# Patient Record
Sex: Female | Born: 1980 | Race: White | Hispanic: No | Marital: Single | Smoking: Never smoker
Health system: Southern US, Community
[De-identification: ages and names within clinical notes are randomized; demographics above are authoritative.]

## PROBLEM LIST (undated history)

## (undated) DIAGNOSIS — G4730 Sleep apnea, unspecified: Secondary | ICD-10-CM

## (undated) DIAGNOSIS — M5412 Radiculopathy, cervical region: Secondary | ICD-10-CM

## (undated) DIAGNOSIS — K5900 Constipation, unspecified: Secondary | ICD-10-CM

## (undated) DIAGNOSIS — R0602 Shortness of breath: Secondary | ICD-10-CM

## (undated) DIAGNOSIS — K8020 Calculus of gallbladder without cholecystitis without obstruction: Secondary | ICD-10-CM

## (undated) DIAGNOSIS — R61 Generalized hyperhidrosis: Secondary | ICD-10-CM

## (undated) DIAGNOSIS — L659 Nonscarring hair loss, unspecified: Secondary | ICD-10-CM

## (undated) DIAGNOSIS — F419 Anxiety disorder, unspecified: Secondary | ICD-10-CM

## (undated) DIAGNOSIS — K219 Gastro-esophageal reflux disease without esophagitis: Secondary | ICD-10-CM

## (undated) DIAGNOSIS — J302 Other seasonal allergic rhinitis: Secondary | ICD-10-CM

## (undated) DIAGNOSIS — F410 Panic disorder [episodic paroxysmal anxiety] without agoraphobia: Secondary | ICD-10-CM

## (undated) DIAGNOSIS — R5381 Other malaise: Secondary | ICD-10-CM

## (undated) DIAGNOSIS — G5603 Carpal tunnel syndrome, bilateral upper limbs: Secondary | ICD-10-CM

## (undated) DIAGNOSIS — M1990 Unspecified osteoarthritis, unspecified site: Secondary | ICD-10-CM

## (undated) DIAGNOSIS — I10 Essential (primary) hypertension: Secondary | ICD-10-CM

## (undated) DIAGNOSIS — F32A Depression, unspecified: Secondary | ICD-10-CM

## (undated) HISTORY — DX: Shortness of breath: R06.02

## (undated) HISTORY — DX: Other malaise: R53.81

## (undated) HISTORY — DX: Nonscarring hair loss, unspecified: L65.9

## (undated) HISTORY — DX: Generalized hyperhidrosis: R61

## (undated) HISTORY — DX: Anxiety disorder, unspecified: F41.9

## (undated) HISTORY — DX: Radiculopathy, cervical region: M54.12

## (undated) HISTORY — PX: OTHER SURGICAL HISTORY: SHX169

---
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
## 1999-03-21 ENCOUNTER — Inpatient Hospital Stay (HOSPITAL_COMMUNITY): Admission: AD | Admit: 1999-03-21 | Discharge: 1999-03-24 | Payer: Self-pay | Admitting: Obstetrics & Gynecology

## 2001-03-23 ENCOUNTER — Emergency Department (HOSPITAL_COMMUNITY): Admission: EM | Admit: 2001-03-23 | Discharge: 2001-03-23 | Payer: Self-pay | Admitting: Emergency Medicine

## 2001-04-29 ENCOUNTER — Ambulatory Visit (HOSPITAL_COMMUNITY): Admission: RE | Admit: 2001-04-29 | Discharge: 2001-04-29 | Payer: Self-pay | Admitting: *Deleted

## 2001-06-28 ENCOUNTER — Ambulatory Visit (HOSPITAL_COMMUNITY): Admission: RE | Admit: 2001-06-28 | Discharge: 2001-06-28 | Payer: Self-pay | Admitting: *Deleted

## 2001-11-15 ENCOUNTER — Inpatient Hospital Stay (HOSPITAL_COMMUNITY): Admission: AD | Admit: 2001-11-15 | Discharge: 2001-11-15 | Payer: Self-pay | Admitting: *Deleted

## 2001-11-18 ENCOUNTER — Inpatient Hospital Stay (HOSPITAL_COMMUNITY): Admission: AD | Admit: 2001-11-18 | Discharge: 2001-11-18 | Payer: Self-pay | Admitting: Obstetrics

## 2001-11-25 ENCOUNTER — Inpatient Hospital Stay (HOSPITAL_COMMUNITY): Admission: AD | Admit: 2001-11-25 | Discharge: 2001-11-26 | Payer: Self-pay | Admitting: Obstetrics & Gynecology

## 2002-12-12 ENCOUNTER — Other Ambulatory Visit: Admission: RE | Admit: 2002-12-12 | Discharge: 2002-12-12 | Payer: Self-pay | Admitting: Family Medicine

## 2004-03-03 ENCOUNTER — Other Ambulatory Visit: Admission: RE | Admit: 2004-03-03 | Discharge: 2004-03-03 | Payer: Self-pay | Admitting: Family Medicine

## 2004-08-02 ENCOUNTER — Emergency Department (HOSPITAL_COMMUNITY): Admission: EM | Admit: 2004-08-02 | Discharge: 2004-08-02 | Payer: Self-pay | Admitting: Emergency Medicine

## 2007-11-12 ENCOUNTER — Encounter (INDEPENDENT_AMBULATORY_CARE_PROVIDER_SITE_OTHER): Payer: Self-pay | Admitting: Unknown Physician Specialty

## 2007-11-12 ENCOUNTER — Other Ambulatory Visit: Admission: RE | Admit: 2007-11-12 | Discharge: 2007-11-12 | Payer: Self-pay | Admitting: Unknown Physician Specialty

## 2008-02-05 ENCOUNTER — Other Ambulatory Visit: Admission: RE | Admit: 2008-02-05 | Discharge: 2008-02-05 | Payer: Self-pay | Admitting: Obstetrics and Gynecology

## 2008-07-20 ENCOUNTER — Inpatient Hospital Stay (HOSPITAL_COMMUNITY): Admission: AD | Admit: 2008-07-20 | Discharge: 2008-07-21 | Payer: Self-pay | Admitting: Obstetrics & Gynecology

## 2008-08-05 ENCOUNTER — Inpatient Hospital Stay (HOSPITAL_COMMUNITY): Admission: AD | Admit: 2008-08-05 | Discharge: 2008-08-08 | Payer: Self-pay | Admitting: Obstetrics and Gynecology

## 2008-08-05 ENCOUNTER — Encounter: Payer: Self-pay | Admitting: Obstetrics and Gynecology

## 2008-08-05 ENCOUNTER — Ambulatory Visit: Payer: Self-pay | Admitting: Obstetrics and Gynecology

## 2009-11-11 ENCOUNTER — Emergency Department (HOSPITAL_COMMUNITY): Admission: EM | Admit: 2009-11-11 | Discharge: 2009-11-11 | Payer: Self-pay | Admitting: Emergency Medicine

## 2011-01-22 LAB — RAPID STREP SCREEN (MED CTR MEBANE ONLY): Streptococcus, Group A Screen (Direct): NEGATIVE

## 2011-03-21 NOTE — Op Note (Signed)
Tiffany Ho, Tiffany Ho            ACCOUNT NO.:  [id number]   MEDICAL RECORD NO.:  [id number]          PATIENT TYPE:  INP   LOCATION:  9111                          FACILITY:  [facility]   PHYSICIAN:  Tilda Burrow, M.D. DATE OF BIRTH:  02-11-1981   DATE OF PROCEDURE:  08/05/2008  DATE OF DISCHARGE:                               OPERATIVE REPORT   PREOPERATIVE DIAGNOSES:  1. Pregnancy at 38-1/2 weeks' gestation.  2. Partial placenta previa.  3. Third-trimester bleeding.   POSTOPERATIVE DIAGNOSES:  1. Pregnancy at 38-1/2 weeks' gestation.  2. Partial placenta previa.  3. Third-trimester bleeding.   PROCEDURE:  Primary low-transverse cervical cesarean section.   SURGEON:  Tilda Burrow, MD   ASSISTANT:  Odie Sera, DO   ANESTHESIA:  Spinal.   COMPLICATIONS:  None.   FINDINGS:  Posteriorly oriented placenta with partial previa, less than  5% of placenta separated from the cervical internal os.   INDICATIONS:  A 30 year old female presenting at 38-1/2 weeks' gestation  with second episode of relatively painless vaginal bleeding of  significant volume.  I ultrasounded once again here at this time  identifying by transvaginal ultrasound that there was a posterior  placenta with partial previa with placenta 2.5 cm thick that was  overlying the internal os by at least 3 cm.  Decision for C-section  recommended.   DETAILS OF PROCEDURE:  The patient was taken to the operating room and  spinal anesthesia introduced.  There was some delay in the spinal taking  full effect.  Fetal heart was documented prior to prepping and draping.  The Pfannenstiel-type incision was performed sharply down to the fascia  and opened transversely.  Dissected off the underlying fascia and split  in the midline.  Bladder flap was developed in the same way on the on  lower uterine segment and the uterus opened transversely with sharp  dissection.  Once the amniotic fluid was encountered, the rest of  the  incision was opened manually and extended cephalad and caudad.  The  fetal vertex was rotated into the incision and delivered with fundal  pressure and manual guidance of the vertex.  Baby was somewhat depressed  initially and was handed to Dr. Francine Graven with some extra cord attached  to the baby.  We then proceeded with obtaining cord blood and routine  blood samples.  IV oxytocin was initiating.  There was no record of  blood loss at this time, but the once the uterine tone was firm, the  placenta was spontaneously expressed in Devens presentation.  There  were no membranes left behind.  The placental bed was then inspected and  reached down to the internal os and either barely over it or immediately  adjacent to it.  There was a significant amount of decidua, but no  active bleeding.  Two-layer running-locking closure of the uterine  incision followed using 0-Vicryl.  The bladder flap was reapproximated  loosely with running 2-0 chromic.  Abdomen was emptied.  Sponge and  needle counts were correct.  Anterior peritoneum closed.  The rectus  muscles were pulled in the midline with  2-0 chromic and fascia closed  with running 0-Vicryl.  Subcutaneous tissues were reapproximated with 2  interrupted sutures of 2-0 plain and staple closure of the skin  completed the procedure.  Lower abdomen was quite lax and uterus very  mobile.  Myometrium was unusually thick on the anterior wall.  The  patient tolerated the procedure well with recovering with EBL 800 mL and  300 mL obtained during the procedure.  Sponge and needle counts were  correct.      Tilda Burrow, M.D.  Electronically Signed     JVF/MEDQ  D:  08/06/2008  T:  08/07/2008  Job:  [id number]   cc:   [facility]

## 2011-03-21 NOTE — Discharge Summary (Signed)
Tiffany Ho, Tiffany Ho            ACCOUNT NO.:  [id number]   MEDICAL RECORD NO.:  [id number]          PATIENT TYPE:  INP   LOCATION:  9111                          FACILITY:  [facility]   PHYSICIAN:  Lesly Dukes, M.D. DATE OF BIRTH:  February 12, 1981   DATE OF ADMISSION:  08/05/2008  DATE OF DISCHARGE:  08/08/2008                               DISCHARGE SUMMARY   DIAGNOSES AT ADMISSION:  1. Painless vaginal bleeding.  2. Term pregnancy.   DIAGNOSIS AT DISCHARGE:  Status post low-transverse cesarean section for  placenta previa with viable female infant.   PROCEDURE:  Low-transverse cesarean section.   HISTORY AND BRIEF HOSPITAL COURSE:  This is a 30 year old G3, P3-0-0-3  at 38 weeks, who presented with painless vaginal bleeding at term  pregnancy.  Ultrasound showed placenta previa.  Cesarean section was  done with epidural with an estimated blood loss of 800 mL.  Baby had  Apgars of 3 at one minutes and 9 at five minutes.  Mother is bottle  feeding, is using Depo-Provera for contraception.  She is GBS negative,  HIV negative, RPR nonreactive, hep B negative, rubella immune.  Followed  a steady course of improvement throughout her hospital stay and by  discharge was ambulatory, eating, passing gas, urinating, and had pain  controlled, and her incision was clean, dry, and intact with staples in  place.   DISCHARGE MEDICATIONS:  1. Tagamet as needed.  2. Tums as needed.  3. Prenatal vitamins 1 tab p.o. daily.  4. Ibuprofen 600 mg p.o. q.6 h. p.r.n. pain.  5. Colace 100 mg p.o. b.i.d. p.r.n. constipation.  6. Ferrous sulfate 325 mg p.o. b.i.d.  7. Percocet 5/325 1-2 tablets p.o. q.6 h. p.r.n. pain.   DISCHARGE CONDITION:  Stable.   DISCHARGE DISPOSITION:  The patient will be discharged home.   ACTIVITIES:  Pelvic rest and no heavy lifting for 6 weeks.   DIET:  Routine diet.   FOLLOWUP:  Followup will be in 6 weeks at the [facility]  Department.  The parents wished to  have their child circumcised at an  outpatient clinic.  I have also ordered the staples to be removed on  Monday, 08/20/2008 by the [facility] nurse and the patient will be  getting her Depo-Provera shot 150 mg intramuscular x1 prior to this  discharge.      Tiffany Langton, MD      Lesly Dukes, M.D.  Electronically Signed    TT/MEDQ  D:  08/08/2008  T:  08/08/2008  Job:  [id number]

## 2011-03-21 NOTE — H&P (Signed)
[name], [name]            ACCOUNT NO.:  [id number]   MEDICAL RECORD NO.:  [id number]          PATIENT TYPE:  INP   LOCATION:  9111                          FACILITY:  [facility]   PHYSICIAN:  Tilda Burrow, M.D. DATE OF BIRTH:  19-Sep-1981   DATE OF ADMISSION:  08/05/2008  DATE OF DISCHARGE:                              HISTORY & PHYSICAL   ADMITTING DIAGNOSES:  1. Pregnancy 38-1/2 weeks' gestation.  2. Third trimester bleeding.  3. Partial placenta previa on ultrasound.   HISTORY OF PRESENT ILLNESS:  This 30 year old female gravida 3, para 2-0-  0-2 with 2 prior babies, greater than 9 pounds 10 ounces admitted after  pregnancy, course followed at [facility].  Pregnancy was notable  for size greater than dates and a Pap smear that was borderline  abnormal, evaluated with colposcopy and requiring suture therapy.  Ultrasounds at 21 weeks and again couple weeks ago with no mention of  placenta previa.  Cervix was long, thick, and closed at the last visit  on July 21, 2008.  Retrieval of records was complicated by the fact  that the patient has recently changed her name from Tiffany Ho to  Tiffany Ho.   The patient presented to [facility] where heavy bleeding was noted and some  mild uterine tenderness appreciated.  Ultrasound was obtained, which  showed a posterior placenta with 2.5-cm thick placenta extending past  the placenta from its posterior predominant position.  The placenta was  extended at least 3 cm past the internal os anteriorly, so  recommendation for cesarean section was made and accepted and procedure  scheduled.   PAST MEDICAL HISTORY:  Benign.   SURGICAL HISTORY:  Negative.   ALLERGIES:  None.   SOCIAL HISTORY:  Married to [employer] 1 month.   PRENATAL LABS:  Include blood type O positive, hemoglobin 14.6,  hematocrit 42 on initial.  Prenatal intake 11.3 and 35.3 at 28 weeks.  Blood type is O positive, rubella immunity present.   Hepatitis, HIV,  RPR, GC and chlamydia all negative.  MSAFP was normal.  Glucose  tolerance test abnormal at 1 hour, 174 mg percent with borderline 3-hour  test.   PHYSICAL EXAMINATION:  VITAL SIGNS:  Height 5 feet 4 inches, weight  approximately 200 pounds.  GENERAL:  Shows a healthy-appearing Caucasian female, anxious, alert,  oriented, well hydrated.  CARDIOVASCULAR:  Unremarkable.  ABDOMEN:  Nontender at lower abdomen and suprapubic tenderness  appreciated.  Abnormal vaginal exam by  speculum shows dark blood with some clots in the vaginal vault.  Cervix  is visually closed per report, and ultrasound shows a 4.2-cm long closed  cervix with placenta behind the internal os.   PLAN:  Primary cesarean section for partial placenta previa at 39-1/2  weeks.      Tilda Burrow, M.D.  Electronically Signed     JVF/MEDQ  D:  08/06/2008  T:  08/06/2008  Job:  [id number]   cc:   [facility]

## 2011-08-07 LAB — CBC
HCT: 34 — ABNORMAL LOW
Hemoglobin: 11.3 — ABNORMAL LOW
MCHC: 33.3
Platelets: 249
RBC: 3.99
RDW: 15.3
RDW: 15.3
WBC: 15.7 — ABNORMAL HIGH

## 2011-08-07 LAB — CROSSMATCH: Antibody Screen: NEGATIVE

## 2011-08-07 LAB — APTT: aPTT: 23 — ABNORMAL LOW

## 2011-08-07 LAB — ABO/RH: ABO/RH(D): O POS

## 2011-08-07 LAB — RPR: RPR Ser Ql: NONREACTIVE

## 2011-08-07 LAB — PROTIME-INR: INR: 0.9

## 2012-07-13 ENCOUNTER — Encounter (HOSPITAL_COMMUNITY): Payer: Self-pay | Admitting: *Deleted

## 2012-07-13 ENCOUNTER — Emergency Department (HOSPITAL_COMMUNITY): Payer: Medicaid Other

## 2012-07-13 ENCOUNTER — Emergency Department (HOSPITAL_COMMUNITY)
Admission: EM | Admit: 2012-07-13 | Discharge: 2012-07-13 | Disposition: A | Discharge status: Home / Self Care | Payer: Medicaid Other | Attending: Emergency Medicine | Admitting: Emergency Medicine

## 2012-07-13 DIAGNOSIS — M549 Dorsalgia, unspecified: Secondary | ICD-10-CM | POA: Insufficient documentation

## 2012-07-13 DIAGNOSIS — R109 Unspecified abdominal pain: Secondary | ICD-10-CM

## 2012-07-13 DIAGNOSIS — R110 Nausea: Secondary | ICD-10-CM | POA: Insufficient documentation

## 2012-07-13 DIAGNOSIS — K8020 Calculus of gallbladder without cholecystitis without obstruction: Secondary | ICD-10-CM

## 2012-07-13 DIAGNOSIS — R1011 Right upper quadrant pain: Secondary | ICD-10-CM | POA: Insufficient documentation

## 2012-07-13 LAB — COMPREHENSIVE METABOLIC PANEL
Albumin: 3.6 g/dL (ref 3.5–5.2)
Alkaline Phosphatase: 88 U/L (ref 39–117)
BUN: 12 mg/dL (ref 6–23)
CO2: 24 mEq/L (ref 19–32)
Chloride: 103 mEq/L (ref 96–112)
GFR calc non Af Amer: >90 mL/min (ref >90)
Potassium: 3.8 mEq/L (ref 3.5–5.1)
Total Bilirubin: 0.1 mg/dL — ABNORMAL LOW (ref 0.3–1.2)

## 2012-07-13 LAB — CBC WITH DIFFERENTIAL/PLATELET
Basophils Relative: 0 % (ref 0–1)
HCT: 40.5 % (ref 36.0–46.0)
Hemoglobin: 14 g/dL (ref 12.0–15.0)
Lymphocytes Relative: 49 % — ABNORMAL HIGH (ref 12–46)
Lymphs Abs: 3.7 10*3/uL (ref 0.7–4.0)
MCHC: 34.6 g/dL (ref 30.0–36.0)
Monocytes Relative: 10 % (ref 3–12)
Neutro Abs: 2.9 10*3/uL (ref 1.7–7.7)
Neutrophils Relative %: 38 % — ABNORMAL LOW (ref 43–77)
RBC: 4.67 MIL/uL (ref 3.87–5.11)
WBC: 7.6 10*3/uL (ref 4.0–10.5)

## 2012-07-13 LAB — URINALYSIS, ROUTINE W REFLEX MICROSCOPIC
Glucose, UA: NEGATIVE mg/dL
Hgb urine dipstick: NEGATIVE
Ketones, ur: NEGATIVE mg/dL
Leukocytes, UA: NEGATIVE
Protein, ur: NEGATIVE mg/dL
Urobilinogen, UA: 0.2 mg/dL (ref 0.0–1.0)

## 2012-07-13 MED ORDER — HYDROMORPHONE HCL PF 1 MG/ML IJ SOLN
1.0000 mg | Freq: Once | INTRAMUSCULAR | Status: AC
  Administered 2012-07-13: 1 mg via INTRAVENOUS
  Filled 2012-07-13: qty 1

## 2012-07-13 MED ORDER — HYDROCODONE-ACETAMINOPHEN 5-325 MG PO TABS: 1.0000 | ORAL_TABLET | ORAL | Status: DC | PRN

## 2012-07-13 MED ORDER — ONDANSETRON HCL 4 MG PO TABS: 4.0000 mg | ORAL_TABLET | Freq: Four times a day (QID) | ORAL | Status: DC

## 2012-07-13 MED ORDER — ONDANSETRON HCL 4 MG/2ML IJ SOLN
4.0000 mg | Freq: Once | INTRAMUSCULAR | Status: AC
  Administered 2012-07-13: 4 mg via INTRAVENOUS
  Filled 2012-07-13: qty 2

## 2012-07-13 MED ORDER — DIPHENHYDRAMINE HCL 50 MG/ML IJ SOLN
25.0000 mg | Freq: Once | INTRAMUSCULAR | Status: AC
  Administered 2012-07-13: 25 mg via INTRAVENOUS
  Filled 2012-07-13: qty 1

## 2012-07-13 NOTE — ED Notes (Signed)
Dr Jenkins in room with pt.  

## 2012-07-13 NOTE — ED Notes (Addendum)
Patient with right sided abdominal pain that radiates to her upper epigastric area and to her right shoulder.  Pain was present before she went to bed and thought it was an indigestion but woke her up this am.  Patient felt nauseated and just cannot get comfortable and experiencing "hot flashes."  Patient also c/o pressure on her chest.  Denies any cardiac history.

## 2012-07-13 NOTE — ED Notes (Signed)
Pt states that she is waiting outside for her husband, states " I am going to another hospital", explained to pt that going to another hospital was her right.

## 2012-07-13 NOTE — ED Notes (Signed)
Dr. Rulon Abide in room with pt at present time, pt given ice pack for comfort due to being "Hot"

## 2012-07-13 NOTE — ED Provider Notes (Signed)
Tiffany Ho is a 31 y.o. female presenting with right upper quadrant pain laboratory work was consistent with symptomatic cholelithiasis. Patient was acquired from Dr. Colon Branch from the overnight shift. Introduced myself to the patient and examined her abdomen, she has mild tenderness in right upper quadrant.  She is nontoxic and afebrile at this time.  07/13/2012 8:07 AM   07/13/2012 9:02 AM Ultrasound shows cholelithiasis without cholecystitis. Discussed findings with patient who is tearful and stating her pain is still coming back this is after 1 mg dose of Dilaudid which is her third in the emergency department since 5:00. She says she cannot go home and have this pain as she has 3 children to take care of. We'll discuss patient's case with the on-call surgeon for possible removal of gallbladder today.  Discussed with Dr. Lovell Sheehan surgeon on call today, he was upstairs rounding on his patients and will stop down in half an hour to evaluate the patient. We discussed likely perform surgery early next week as early as Monday. Discussed this with the patient and she still seemed dissatisfied - she says "I will go to another hospital and get it done" and then "I'm not leaving here in pain" patient is in requesting more Dilaudid injection. Patient is clearly frustrated with not being able to have the surgery today however explained at length, the patient does not have an emergent surgical condition for which to call in operating room staff for.  Dr. Lovell Sheehan to evaluate.  07/13/2012 10:19 AM Patient was medicated with IV Benadryl itching likely due to histamine release from narcotics. Dr. Lovell Sheehan seen the patient and has offered to remove the gallbladder at 9 AM on Monday morning. Patient continues to be adversarial toward the staff because she would like her surgery today.  Explained the circumstances of her medical condition and why the surgery would not be done immediately, and she would likely thing to  another hospital who will take her gallbladder out today. The patient is stable, her vital signs are within normal limits and she'll be discharged in good condition. She is reminded that she can come back any time to the emergency department for further treatment or evaluation.     Jones Skene, MD 07/13/12 1020

## 2012-07-13 NOTE — ED Notes (Signed)
Spoke to pt who is upset that the emergency room can not arrange for surgery for her today, explained to pt  that the emergency room had given pt treatment, that Dr. Lovell Sheehan had been in to see pt and she had refused admission and had advised that she would be seeking care at another hospital to see if she could get surgery today, spoke with Dr. Rulon Abide who advised pt that she would need to follow up with Dr. Lovell Sheehan on Monday, pt also wanted to know if she had the option of going to another surgeon, advised pt that she could go to another surgeon, that Dr. Lovell Sheehan was the surgeon on call today. Pt wanted to know what the er would do about billing with another surgeon advised pt that the er would have nothing to do with their billing. Pt requesting to speak with supervisor, pt given [facility] phone number. And advised that she would need to follow up with Dr. Lovell Sheehan.

## 2012-07-13 NOTE — ED Notes (Signed)
Pt given mouth swab due to dry mouth, update given on delay, pt waiting for ultrasound results.

## 2012-07-13 NOTE — ED Notes (Signed)
Pt asking for more pain medication, c/o itching as well, Dr. Rulon Abide notified, additional orders given

## 2012-07-13 NOTE — ED Notes (Signed)
Pt upset over possibility of being sent home and having surgery another day, pt states " I don'[t understand why I can't have the surgery now" I don't want to be in pain, explained to pt that if she did go home she would have pain medication at home for use, pt states "I don't want to use that for pain, I want the surgery" explained to pt the rationale of a planned surgery and an emergency surgery. Pt's response, "I will go to another hospital until I find someone to do the surgery today". Comfort measure provided. Pt waiting for Dr. Lovell Sheehan to evaluate pt in er.

## 2012-07-13 NOTE — Consult Note (Signed)
Reason for Consult: Right upper quadrant abdominal pain Referring Physician: ER  Tiffany Ho is an 31 y.o. female.  HPI: Patient is a 31 year old white female who started having right quadrant abdominal pain earlier this morning. This is her first episode. Ultrasound of gallbladder revealed cholelithiasis, though no gallbladder wall thickening was noted. There was no evidence of acute cholecystitis. Her common bile duct was within normal limits. She states that she has no current nausea or vomiting. She denies any fatty food intolerance.  History reviewed. No pertinent past medical history.  Past Surgical History  Procedure Date  . Cesarean section     History reviewed. No pertinent family history.  Social History:  reports that she has never smoked. She does not have any smokeless tobacco history on file. She reports that she does not drink alcohol. Her drug history not on file.  Allergies: No Known Allergies  Medications: I have reviewed the patient's current medications.  Results for orders placed during the hospital encounter of 07/13/12 (from the past 48 hour(s))  CBC WITH DIFFERENTIAL     Status: Abnormal   Collection Time   07/13/12  5:35 AM      Component Value Range Comment   WBC 7.6  4.0 - 10.5 K/uL    RBC 4.67  3.87 - 5.11 MIL/uL    Hemoglobin 14.0  12.0 - 15.0 g/dL    HCT 40.9  81.1 - 91.4 %    MCV 86.7  78.0 - 100.0 fL    MCH 30.0  26.0 - 34.0 pg    MCHC 34.6  30.0 - 36.0 g/dL    RDW 78.2  95.6 - 21.3 %    Platelets 319  150 - 400 K/uL    Neutrophils Relative 38 (*) 43 - 77 %    Neutro Abs 2.9  1.7 - 7.7 K/uL    Lymphocytes Relative 49 (*) 12 - 46 %    Lymphs Abs 3.7  0.7 - 4.0 K/uL    Monocytes Relative 10  3 - 12 %    Monocytes Absolute 0.8  0.1 - 1.0 K/uL    Eosinophils Relative 3  0 - 5 %    Eosinophils Absolute 0.3  0.0 - 0.7 K/uL    Basophils Relative 0  0 - 1 %    Basophils Absolute 0.0  0.0 - 0.1 K/uL   COMPREHENSIVE METABOLIC PANEL     Status:  Abnormal   Collection Time   07/13/12  5:35 AM      Component Value Range Comment   Sodium 138  135 - 145 mEq/L    Potassium 3.8  3.5 - 5.1 mEq/L    Chloride 103  96 - 112 mEq/L    CO2 24  19 - 32 mEq/L    Glucose, Bld 118 (*) 70 - 99 mg/dL    BUN 12  6 - 23 mg/dL    Creatinine, Ser 0.86  0.50 - 1.10 mg/dL    Calcium 9.2  8.4 - 57.8 mg/dL    Total Protein 7.1  6.0 - 8.3 g/dL    Albumin 3.6  3.5 - 5.2 g/dL    AST 23  0 - 37 U/L    ALT 24  0 - 35 U/L    Alkaline Phosphatase 88  39 - 117 U/L    Total Bilirubin 0.1 (*) 0.3 - 1.2 mg/dL    GFR calc non Af Amer >90  >90 mL/min    GFR calc Af  Amer >90  >90 mL/min   URINALYSIS, ROUTINE W REFLEX MICROSCOPIC     Status: Abnormal   Collection Time   07/13/12  5:55 AM      Component Value Range Comment   Color, Urine YELLOW  YELLOW    APPearance HAZY (*) CLEAR    Specific Gravity, Urine >1.030 (*) 1.005 - 1.030    pH 6.0  5.0 - 8.0    Glucose, UA NEGATIVE  NEGATIVE mg/dL    Hgb urine dipstick NEGATIVE  NEGATIVE    Bilirubin Urine NEGATIVE  NEGATIVE    Ketones, ur NEGATIVE  NEGATIVE mg/dL    Protein, ur NEGATIVE  NEGATIVE mg/dL    Urobilinogen, UA 0.2  0.0 - 1.0 mg/dL    Nitrite NEGATIVE  NEGATIVE    Leukocytes, UA NEGATIVE  NEGATIVE MICROSCOPIC NOT DONE ON URINES WITH NEGATIVE PROTEIN, BLOOD, LEUKOCYTES, NITRITE, OR GLUCOSE <1000 mg/dL.  PREGNANCY, URINE     Status: Normal   Collection Time   07/13/12  5:55 AM      Component Value Range Comment   Preg Test, Ur NEGATIVE  NEGATIVE     US Abdomen Limited Ruq  07/13/2012  *RADIOLOGY REPORT*  Clinical Data:  Right upper quadrant pain  LIMITED ABDOMINAL ULTRASOUND - RIGHT UPPER QUADRANT  Comparison:  None.  Findings:  Gallbladder:  Echogenic material layers within the gallbladder lumen.  There is associated shadowing.  This is most consistent with multiple small stones and sludge. There is one larger echogenic shadowing focus measuring up to 11 mm consistent with a slightly larger gallstone. No  pericholecystic fluid.  The gallbladder is not distended.  Gallbladder wall thickness is normal at 1.2 mm.  Per the sonographer, the sonographic Murphy's sign was negative.  Common bile duct:  Within normal limits in caliber at 4.8 mm.  Liver:  No focal lesion identified.  Mildly echogenic hepatic parenchyma.  IMPRESSION:  1.  Cholelithiasis without sonographic evidence to suggest acute cholecystitis. 2.  Possible mild hepatic steatosis.   Original Report Authenticated By: HEATH     ROS: See chart Blood pressure 124/73, pulse 75, temperature 97.6 F (36.4 C), temperature source Oral, resp. rate 20, height 5\' 4"  (1.626 m), weight 83.008 kg (183 lb), SpO2 99.00%. Physical Exam: Well-developed well-nourished white female who is somewhat belligerent. She was watching TV while lying in the bed. Abdomen: Soft. Tenderness in the right upper quadrant to palpation, though no rigidity, hepatosplenomegaly were noted.  Assessment/Plan: Impression: Biliary colic, cholelithiasis. I offered admission to the patient for pain control or outpatient pain control with doing the surgery on 07/15/2012. She became belligerent and stated that I should be doing the surgery now. I told her that this was not an emergency and that I would do the surgery at the next available elective time slot, which is Monday morning. She was not happy with those choices and stated that she was leaving.   Nichalas Coin A 07/13/2012, 10:17 AM

## 2012-07-13 NOTE — ED Provider Notes (Signed)
History     CSN: [id number]  Arrival date & time 07/13/12  7829   First MD Initiated Contact with Patient 07/13/12 [phone]      Chief Complaint  Patient presents with  . Abdominal Pain    (Consider location/radiation/quality/duration/timing/severity/associated sxs/prior treatment) HPI Tiffany Ho is a 31 y.o. female who presents to the Emergency Department complaining of RUQ pain that began at 8:00 PM last night and has gotten progressively worse. She had taken pepcid with no relief and was unable to rest. Pain is located in the RUQ and radiates around the right side to her back and up to the shoulder blade. Associated with nausea, no vomiting or diarrhea. Last ate between 6:30-7:00 PM.Deneis fever, chills.  History reviewed. No pertinent past medical history.  Past Surgical History  Procedure Date  . Cesarean section     History reviewed. No pertinent family history.  History  Substance Use Topics  . Smoking status: Never Smoker   . Smokeless tobacco: Not on file  . Alcohol Use: No    OB History    Grav Para Term Preterm Abortions TAB SAB Ect Mult Living                  Review of Systems  Constitutional: Negative for fever.       10 Systems reviewed and are negative for acute change except as noted in the HPI.  HENT: Negative for congestion.   Eyes: Negative for discharge and redness.  Respiratory: Negative for cough and shortness of breath.   Cardiovascular: Negative for chest pain.  Gastrointestinal: Positive for nausea and abdominal pain. Negative for vomiting.  Musculoskeletal: Negative for back pain.  Skin: Negative for rash.  Neurological: Negative for syncope, numbness and headaches.  Psychiatric/Behavioral:       No behavior change.    Allergies  Review of patient's allergies indicates no known allergies.  Home Medications   Current Outpatient Rx  Name Route Sig Dispense Refill  . NORGESTIM-ETH ESTRAD TRIPHASIC 0.18/0.215/0.25 MG-25 MCG PO  TABS Oral Take 1 tablet by mouth daily.      BP 134/79  Pulse 78  Temp 97.6 F (36.4 C) (Oral)  Resp 20  Ht 5\' 4"  (1.626 m)  Wt 183 lb (83.008 kg)  BMI 31.41 kg/m2  SpO2 99%  Physical Exam  Nursing note and vitals reviewed. Constitutional:       Awake, alert, nontoxic appearance.  HENT:  Head: Atraumatic.  Eyes: Right eye exhibits no discharge. Left eye exhibits no discharge.  Neck: Neck supple.  Cardiovascular: Normal heart sounds.   Pulmonary/Chest: Effort normal and breath sounds normal. She exhibits no tenderness.  Abdominal: Soft. There is tenderness. There is guarding. There is no rebound.       RUQ pain with minimum palpation. Pushes hand away.  Musculoskeletal: She exhibits no tenderness.       Baseline ROM, no obvious new focal weakness.  Neurological:       Mental status and motor strength appears baseline for patient and situation.  Skin: No rash noted.  Psychiatric: She has a normal mood and affect.    ED Course  Procedures (including critical care time) Results for orders placed during the hospital encounter of 07/13/12  CBC WITH DIFFERENTIAL      Component Value Range   WBC 7.6  4.0 - 10.5 K/uL   RBC 4.67  3.87 - 5.11 MIL/uL   Hemoglobin 14.0  12.0 - 15.0 g/dL   HCT 30.8  36.0 - 46.0 %   MCV 86.7  78.0 - 100.0 fL   MCH 30.0  26.0 - 34.0 pg   MCHC 34.6  30.0 - 36.0 g/dL   RDW 08.6  57.8 - 46.9 %   Platelets 319  150 - 400 K/uL   Neutrophils Relative 38 (*) 43 - 77 %   Neutro Abs 2.9  1.7 - 7.7 K/uL   Lymphocytes Relative 49 (*) 12 - 46 %   Lymphs Abs 3.7  0.7 - 4.0 K/uL   Monocytes Relative 10  3 - 12 %   Monocytes Absolute 0.8  0.1 - 1.0 K/uL   Eosinophils Relative 3  0 - 5 %   Eosinophils Absolute 0.3  0.0 - 0.7 K/uL   Basophils Relative 0  0 - 1 %   Basophils Absolute 0.0  0.0 - 0.1 K/uL  COMPREHENSIVE METABOLIC PANEL      Component Value Range   Sodium 138  135 - 145 mEq/L   Potassium 3.8  3.5 - 5.1 mEq/L   Chloride 103  96 - 112 mEq/L    CO2 24  19 - 32 mEq/L   Glucose, Bld 118 (*) 70 - 99 mg/dL   BUN 12  6 - 23 mg/dL   Creatinine, Ser 6.29  0.50 - 1.10 mg/dL   Calcium 9.2  8.4 - 52.8 mg/dL   Total Protein 7.1  6.0 - 8.3 g/dL   Albumin 3.6  3.5 - 5.2 g/dL   AST 23  0 - 37 U/L   ALT 24  0 - 35 U/L   Alkaline Phosphatase 88  39 - 117 U/L   Total Bilirubin 0.1 (*) 0.3 - 1.2 mg/dL   GFR calc non Af Amer >90  >90 mL/min   GFR calc Af Amer >90  >90 mL/min  URINALYSIS, ROUTINE W REFLEX MICROSCOPIC      Component Value Range   Color, Urine YELLOW  YELLOW   APPearance HAZY (*) CLEAR   Specific Gravity, Urine >1.030 (*) 1.005 - 1.030   pH 6.0  5.0 - 8.0   Glucose, UA NEGATIVE  NEGATIVE mg/dL   Hgb urine dipstick NEGATIVE  NEGATIVE   Bilirubin Urine NEGATIVE  NEGATIVE   Ketones, ur NEGATIVE  NEGATIVE mg/dL   Protein, ur NEGATIVE  NEGATIVE mg/dL   Urobilinogen, UA 0.2  0.0 - 1.0 mg/dL   Nitrite NEGATIVE  NEGATIVE   Leukocytes, UA NEGATIVE  NEGATIVE  PREGNANCY, URINE      Component Value Range   Preg Test, Ur NEGATIVE  NEGATIVE    No results found.  Date: 07/13/2012   4132  Rate:66  Rhythm: normal sinus rhythm  QRS Axis: normal  Intervals: normal  ST/T Wave abnormalities: normal  Conduction Disutrbances: none  Narrative Interpretation: unremarkable     No diagnosis found.    MDM  Patient with RUQ abdominal pain c/w gallbladder disease. Labs are unremarkable. Awaiting ultrasound.Care/disposition to Dr. Rulon Abide.        Nicoletta Dress. Colon Branch, MD 07/13/12 [phone]

## 2012-07-13 NOTE — ED Notes (Signed)
Pt has returned call to er and wants to know if she decides to have surgery on Monday if she can just "show up", spoke with Dr. Rulon Abide who advised to inform pt that she will need to follow up with Dr. Lovell Sheehan. Message left for pt to return call to er.

## 2012-07-14 ENCOUNTER — Encounter (HOSPITAL_COMMUNITY): Payer: Self-pay | Admitting: *Deleted

## 2012-07-14 DIAGNOSIS — K8020 Calculus of gallbladder without cholecystitis without obstruction: Secondary | ICD-10-CM | POA: Insufficient documentation

## 2012-07-14 NOTE — ED Notes (Addendum)
Pt recently seen at [facility] for gallstones, pt was reffered to a surgeon to have gallstone out, but was told to make an out patient appointment. Pt states the pain medication that makes her itch (dilaudid) when she was in the hospital. so she wasn't able to take the medication. Pt states that she has not been able to eat and is in severe pain. Pt nauseated, no vomiting.

## 2012-07-15 ENCOUNTER — Encounter (HOSPITAL_COMMUNITY): Payer: Self-pay | Admitting: Emergency Medicine

## 2012-07-15 ENCOUNTER — Emergency Department (HOSPITAL_COMMUNITY)
Admission: EM | Admit: 2012-07-15 | Discharge: 2012-07-15 | Disposition: A | Discharge status: Home / Self Care | Payer: Medicaid Other | Attending: Emergency Medicine | Admitting: Emergency Medicine

## 2012-07-15 DIAGNOSIS — K8020 Calculus of gallbladder without cholecystitis without obstruction: Secondary | ICD-10-CM

## 2012-07-15 HISTORY — DX: Calculus of gallbladder without cholecystitis without obstruction: K80.20

## 2012-07-15 LAB — CBC WITH DIFFERENTIAL/PLATELET
Basophils Relative: 0 % (ref 0–1)
Eosinophils Absolute: 0.1 10*3/uL (ref 0.0–0.7)
Hemoglobin: 13.2 g/dL (ref 12.0–15.0)
Lymphs Abs: 3.3 10*3/uL (ref 0.7–4.0)
MCH: 30.1 pg (ref 26.0–34.0)
Neutro Abs: 6.5 10*3/uL (ref 1.7–7.7)
Neutrophils Relative %: 63 % (ref 43–77)
Platelets: 363 10*3/uL (ref 150–400)
RBC: 4.38 MIL/uL (ref 3.87–5.11)

## 2012-07-15 LAB — COMPREHENSIVE METABOLIC PANEL
AST: 32 U/L (ref 0–37)
BUN: 11 mg/dL (ref 6–23)
CO2: 27 mEq/L (ref 19–32)
Calcium: 9.4 mg/dL (ref 8.4–10.5)
Chloride: 101 mEq/L (ref 96–112)
Creatinine, Ser: 0.73 mg/dL (ref 0.50–1.10)
GFR calc Af Amer: >90 mL/min (ref >90)
GFR calc non Af Amer: >90 mL/min (ref >90)
Glucose, Bld: 98 mg/dL (ref 70–99)
Total Bilirubin: 0.3 mg/dL (ref 0.3–1.2)

## 2012-07-15 MED ORDER — FENTANYL CITRATE 0.05 MG/ML IJ SOLN
50.0000 ug | Freq: Once | INTRAMUSCULAR | Status: AC
  Administered 2012-07-15: 50 ug via INTRAVENOUS
  Filled 2012-07-15: qty 2

## 2012-07-15 MED ORDER — SODIUM CHLORIDE 0.9 % IV BOLUS (SEPSIS)
1000.0000 mL | Freq: Once | INTRAVENOUS | Status: AC
  Administered 2012-07-15: 1000 mL via INTRAVENOUS

## 2012-07-15 MED ORDER — OXYCODONE-ACETAMINOPHEN 5-325 MG PO TABS: 1.0000 | ORAL_TABLET | Freq: Four times a day (QID) | ORAL | Status: AC | PRN

## 2012-07-15 MED ORDER — ONDANSETRON HCL 4 MG/2ML IJ SOLN
4.0000 mg | Freq: Once | INTRAMUSCULAR | Status: AC
  Administered 2012-07-15: 4 mg via INTRAVENOUS
  Filled 2012-07-15: qty 2

## 2012-07-15 MED ORDER — ONDANSETRON HCL 4 MG PO TABS: 4.0000 mg | ORAL_TABLET | Freq: Four times a day (QID) | ORAL | Status: DC

## 2012-07-15 NOTE — ED Provider Notes (Signed)
History     CSN: [id number]  Arrival date & time 07/14/12  2315   First MD Initiated Contact with Patient 07/15/12 0222      Chief Complaint  Patient presents with  . Abdominal Pain    (Consider location/radiation/quality/duration/timing/severity/associated sxs/prior treatment) HPI  She presents to the emergency department for evaluation of abdominal pain. The patient states that this pain is the same as pain she was seen for it and pain yesterday. The patient was diagnosed with cholelithiasis and was evaluated by the surgeon. The surgeon said that she was not an acute surgical case but that he would see her on Monday and take it out at 9 AM. The patient wanted her gallbladder to be taken out that day and admits that she didn't understand why she needed to wait. So she declined having the surgeon take her to surgery on Monday. She would like for the surgeon to see her here today because she is still having pain. VSS/NAD. She has been having nausea and vomiting as well.   Past Medical History  Diagnosis Date  . Gallstones     Past Surgical History  Procedure Date  . Cesarean section     History reviewed. No pertinent family history.  History  Substance Use Topics  . Smoking status: Never Smoker   . Smokeless tobacco: Not on file  . Alcohol Use: No    OB History    Grav Para Term Preterm Abortions TAB SAB Ect Mult Living                  Review of Systems   Review of Systems  Gen: no weight loss, fevers, chills, night sweats  Eyes: no discharge or drainage, no occular pain or visual changes  Nose: no epistaxis or rhinorrhea  Mouth: no dental pain, no sore throat  Neck: no neck pain  Lungs:No wheezing, coughing or hemoptysis CV: no chest pain, palpitations, dependent edema or orthopnea  Abd: + abdominal pain, nausea, vomiting  GU: no dysuria or gross hematuria  MSK:  No abnormalities  Neuro: no headache, no focal neurologic deficits  Skin: no  abnormalities Psyche: negative.    Allergies  Review of patient's allergies indicates no known allergies.  Home Medications   Current Outpatient Rx  Name Route Sig Dispense Refill  . BISMUTH SUBSALICYLATE 262 MG/15ML PO SUSP Oral Take 15 mLs by mouth every 6 (six) hours as needed. For indigestion    . CITALOPRAM HYDROBROMIDE 20 MG PO TABS Oral Take 20 mg by mouth daily.    Marland Kitchen HYDROCODONE-ACETAMINOPHEN 5-325 MG PO TABS Oral Take 1 tablet by mouth every 4 (four) hours as needed for pain. 10 tablet 0  . NORGESTIM-ETH ESTRAD TRIPHASIC 0.18/0.215/0.25 MG-25 MCG PO TABS Oral Take 1 tablet by mouth daily.    Marland Kitchen ONDANSETRON HCL 4 MG PO TABS Oral Take 1 tablet (4 mg total) by mouth every 6 (six) hours. 12 tablet 0  . PSEUDOEPH-DOXYLAMINE-DM-APAP 60-7.04-04-999 MG/30ML PO LIQD Oral Take 30 mLs by mouth at bedtime as needed. For sleep/pain    . ONDANSETRON HCL 4 MG PO TABS Oral Take 1 tablet (4 mg total) by mouth every 6 (six) hours. 12 tablet 0  . OXYCODONE-ACETAMINOPHEN 5-325 MG PO TABS Oral Take 1 tablet by mouth every 6 (six) hours as needed for pain. 15 tablet 0    BP 131/81  Pulse 85  Temp 98.7 F (37.1 C) (Oral)  Resp 16  SpO2 98%  LMP 06/14/2012  Physical  Exam  Nursing note and vitals reviewed. Constitutional: She appears well-developed and well-nourished. No distress.  HENT:  Head: Normocephalic and atraumatic.  Eyes: Pupils are equal, round, and reactive to light.  Neck: Normal range of motion. Neck supple.  Cardiovascular: Normal rate and regular rhythm.   Pulmonary/Chest: Effort normal.  Abdominal: Soft. Bowel sounds are normal. She exhibits no distension and no mass. There is tenderness (RUQ). There is no rebound and no guarding.  Neurological: She is alert.  Skin: Skin is warm and dry.    ED Course  Procedures (including critical care time)  Labs Reviewed  COMPREHENSIVE METABOLIC PANEL - Abnormal; Notable for the following:    ALT 43 (*)     All other components  within normal limits  LIPASE, BLOOD  CBC WITH DIFFERENTIAL  URINALYSIS, ROUTINE W REFLEX MICROSCOPIC   US Abdomen Limited Ruq  07/13/2012  *RADIOLOGY REPORT*  Clinical Data:  Right upper quadrant pain  LIMITED ABDOMINAL ULTRASOUND - RIGHT UPPER QUADRANT  Comparison:  None.  Findings:  Gallbladder:  Echogenic material layers within the gallbladder lumen.  There is associated shadowing.  This is most consistent with multiple small stones and sludge. There is one larger echogenic shadowing focus measuring up to 11 mm consistent with a slightly larger gallstone. No pericholecystic fluid.  The gallbladder is not distended.  Gallbladder wall thickness is normal at 1.2 mm.  Per the sonographer, the sonographic Murphy's sign was negative.  Common bile duct:  Within normal limits in caliber at 4.8 mm.  Liver:  No focal lesion identified.  Mildly echogenic hepatic parenchyma.  IMPRESSION:  1.  Cholelithiasis without sonographic evidence to suggest acute cholecystitis. 2.  Possible mild hepatic steatosis.   Original Report Authenticated By: HEATH      1. Cholelithiasis       MDM  Patient's labs have come back nonacute. He is upset because she says that she cannot afford to have the surgery as an outpatient and doesn't understand why the surgeon cannot just see her here. She also does not understand why the surgeon yesterday would not take out her gallbladder. Her pain was controlled here in the emergency department. She says that the Norco she was given made her feel itchy and 6. She was given additional in the ED she said that that did not bother her. I will try giving her a prescription for Percocet as maybe she will tolerate this better. She has been given a referral for general surgery and has been advised to call with the office opens to discuss elective cholecystectomy.  Pt has been advised of the symptoms that warrant their return to the ED. Patient has voiced understanding and has agreed to follow-up  with the PCP or specialist.'      Dorthula Matas, PA 07/15/12 [phone]

## 2012-07-15 NOTE — ED Notes (Signed)
PT ambulated with baseline gait; VSS; A&Ox3; no signs of distress; respirations even and unlabored; skin warm and dry; no questions upon discharge.  

## 2012-07-15 NOTE — ED Notes (Signed)
PT ambulated to restroom to provide sample.

## 2012-07-15 NOTE — ED Notes (Signed)
MD at bedside. 

## 2012-07-16 ENCOUNTER — Ambulatory Visit (INDEPENDENT_AMBULATORY_CARE_PROVIDER_SITE_OTHER): Payer: Medicaid Other | Admitting: General Surgery

## 2012-07-16 ENCOUNTER — Encounter (INDEPENDENT_AMBULATORY_CARE_PROVIDER_SITE_OTHER): Payer: Self-pay | Admitting: General Surgery

## 2012-07-16 VITALS — BP 124/80 | HR 80 | Temp 98.2°F | Resp 16 | Ht 64.0 in | Wt 183.0 lb

## 2012-07-16 DIAGNOSIS — K8020 Calculus of gallbladder without cholecystitis without obstruction: Secondary | ICD-10-CM | POA: Insufficient documentation

## 2012-07-16 NOTE — Progress Notes (Signed)
Patient ID: Tiffany Ho, female   DOB: Jan 01, 1981, 31 y.o.   MRN: [id number]  Chief Complaint  Patient presents with  . New Evaluation    eval gb    HPI Tiffany Ho is a 31 y.o. female.  She is referred by Dr. Dierdre Ho in the measures for emergent evaluation of symptomatic gallstones.  The patient states he has had mild episodes of indigestion and upper abdominal discomfort for at least 2 months. This past weekend she had 2 episodes of severe pain in the right upper quadrant radiating to the right flank with associated nausea. No vomiting. No fever. No change in bowel habits. Chronically constipated. Some diaphoresis.  She went to the [facility] emergency room and was diagnosed with gallstones, saw a  Careers adviser, and was discharged home. The pain came back and she went to [facility]  24 hours later. She was given the same diagnosis and discharged home since her lab work showed no acute findings.  He was worked into my office today. She states that she still has some discomfort but the pain is not severe. She is frustrated that she has had to wait for surgery. HPI  Past Medical History  Diagnosis Date  . Gallstones   . Anxiety     Past Surgical History  Procedure Date  . Cesarean section     No family history on file.  Social History History  Substance Use Topics  . Smoking status: Never Smoker   . Smokeless tobacco: Not on file  . Alcohol Use: No    Allergies  Allergen Reactions  . Vicodin (Hydrocodone-Acetaminophen) Itching    Current Outpatient Prescriptions  Medication Sig Dispense Refill  . citalopram (CELEXA) 20 MG tablet Take 20 mg by mouth daily.      . Norgestimate-Ethinyl Estradiol Triphasic (ORTHO TRI-CYCLEN LO) 0.18/0.215/0.25 MG-25 MCG tab Take 1 tablet by mouth daily.      . ondansetron (ZOFRAN) 4 MG tablet Take 1 tablet (4 mg total) by mouth every 6 (six) hours.  12 tablet  0  . ondansetron (ZOFRAN) 4 MG tablet Take 1 tablet (4 mg total) by mouth  every 6 (six) hours.  12 tablet  0  . oxyCODONE-acetaminophen (PERCOCET/ROXICET) 5-325 MG per tablet Take 1 tablet by mouth every 6 (six) hours as needed for pain.  15 tablet  0    Review of Systems Review of Systems  Constitutional: Negative for fever, chills and unexpected weight change.  HENT: Negative for hearing loss, congestion, sore throat, trouble swallowing and voice change.   Eyes: Negative for visual disturbance.  Respiratory: Positive for cough. Negative for wheezing.   Cardiovascular: Negative for chest pain, palpitations and leg swelling.  Gastrointestinal: Positive for nausea, abdominal pain and constipation. Negative for vomiting, diarrhea, blood in stool, abdominal distention and anal bleeding.  Genitourinary: Negative for hematuria, vaginal bleeding and difficulty urinating.  Musculoskeletal: Negative for arthralgias.  Skin: Negative for rash and wound.  Neurological: Negative for seizures, syncope and headaches.  Hematological: Negative for adenopathy. Does not bruise/bleed easily.  Psychiatric/Behavioral: Negative for confusion.    Blood pressure 124/80, pulse 80, temperature 98.2 F (36.8 C), resp. rate 16, height 5\' 4"  (1.626 m), weight 183 lb (83.008 kg), last menstrual period 06/14/2012.  Physical Exam Physical Exam  Constitutional: She is oriented to person, place, and time. She appears well-developed and well-nourished. No distress.       BMI 31.41  HENT:  Head: Normocephalic and atraumatic.  Nose: Nose normal.  Mouth/Throat: No oropharyngeal exudate.  Eyes: Conjunctivae and EOM are normal. Pupils are equal, round, and reactive to light. Left eye exhibits no discharge. No scleral icterus.  Neck: Neck supple. No JVD present. No tracheal deviation present. No thyromegaly present.  Cardiovascular: Normal rate, regular rhythm, normal heart sounds and intact distal pulses.   No murmur heard. Pulmonary/Chest: Effort normal and breath sounds normal. No  respiratory distress. She has no wheezes. She has no rales. She exhibits no tenderness.  Abdominal: Soft. Bowel sounds are normal. She exhibits no distension and no mass. There is no tenderness. There is no rebound and no guarding.       Subjectively tender right upper quadrant. Objectively soft, nondistended, no mass. No guarding.  Musculoskeletal: She exhibits no edema and no tenderness.  Lymphadenopathy:    She has no cervical adenopathy.  Neurological: She is alert and oriented to person, place, and time. She exhibits normal muscle tone. Coordination normal.  Skin: Skin is warm. No rash noted. She is not diaphoretic. No erythema. No pallor.  Psychiatric: She has a normal mood and affect. Her behavior is normal. Judgment and thought content normal.    Data Reviewed Emergency department notes, labs, ultrasound.  Assessment    Chronic cholecystitis with cholelithiasis. Now with accelerating biliary colic.  Mild obesity  Multiparity  Status post cesarean section  Depression, on citalopram.    Plan    Lowfat diet advised.  Schedule for laparoscopic cholecystectomy with cholangiogram.  I discussed the indications, details, techniques, and numerous risk of the surgery with the patient. She has been given written information with diagrams. She understands these issues. Her questions were answered. She agrees with this plan.       Angelia Mould. Derrell Lolling, M.D., [facility] General and Minimally invasive Surgery Breast and Colorectal Surgery Office:   [phone] Pager:   [phone]  07/16/2012, 2:30 PM

## 2012-07-16 NOTE — ED Provider Notes (Signed)
Medical screening examination/treatment/procedure(s) were performed by non-physician practitioner and as supervising physician I was immediately available for consultation/collaboration.   Mannix Kroeker, MD 07/16/12 0233 

## 2012-07-16 NOTE — Patient Instructions (Signed)
Your episodes of right upper quadrant abdominal pain are due to  gallstones.  You will be scheduled for a laparoscopic cholecystectomy with cholangiogram.  Avoid any fatty foods or greasy foods.    Laparoscopic Cholecystectomy Laparoscopic cholecystectomy is surgery to remove the gallbladder. The gallbladder is located slightly to the right of center in the abdomen, behind the liver. It is a concentrating and storage sac for the bile produced in the liver. Bile aids in the digestion and absorption of fats. Gallbladder disease (cholecystitis) is an inflammation of your gallbladder. This condition is usually caused by a buildup of gallstones (cholelithiasis) in your gallbladder. Gallstones can block the flow of bile, resulting in inflammation and pain. In severe cases, emergency surgery may be required. When emergency surgery is not required, you will have time to prepare for the procedure. Laparoscopic surgery is an alternative to open surgery. Laparoscopic surgery usually has a shorter recovery time. Your common bile duct may also need to be examined and explored. Your caregiver will discuss this with you if he or she feels this should be done. If stones are found in the common bile duct, they may be removed. LET YOUR CAREGIVER KNOW ABOUT:  Allergies to food or medicine.   Medicines taken, including vitamins, herbs, eyedrops, over-the-counter medicines, and creams.   Use of steroids (by mouth or creams).   Previous problems with anesthetics or numbing medicines.   History of bleeding problems or blood clots.   Previous surgery.   Other health problems, including diabetes and kidney problems.   Possibility of pregnancy, if this applies.  RISKS AND COMPLICATIONS All surgery is associated with risks. Some problems that may occur following this procedure include:  Infection.   Damage to the common bile duct, nerves, arteries, veins, or other internal organs such as the stomach or  intestines.   Bleeding.   A stone may remain in the common bile duct.  BEFORE THE PROCEDURE  Do not take aspirin for 3 days prior to surgery or blood thinners for 1 week prior to surgery.   Do not eat or drink anything after midnight the night before surgery.   Let your caregiver know if you develop a cold or other infectious problem prior to surgery.   You should be present 60 minutes before the procedure or as directed.  PROCEDURE  You will be given medicine that makes you sleep (general anesthetic). When you are asleep, your surgeon will make several small cuts (incisions) in your abdomen. One of these incisions is used to insert a small, lighted scope (laparoscope) into the abdomen. The laparoscope helps the surgeon see into your abdomen. Carbon dioxide gas will be pumped into your abdomen. The gas allows more room for the surgeon to perform your surgery. Other operating instruments are inserted through the other incisions. Laparoscopic procedures may not be appropriate when:  There is major scarring from previous surgery.   The gallbladder is extremely inflamed.   There are bleeding disorders or unexpected cirrhosis of the liver.   A pregnancy is near term.   Other conditions make the laparoscopic procedure impossible.  If your surgeon feels it is not safe to continue with a laparoscopic procedure, he or she will perform an open abdominal procedure. In this case, the surgeon will make an incision to open the abdomen. This gives the surgeon a larger view and field to work within. This may allow the surgeon to perform procedures that sometimes cannot be performed with a laparoscope alone. Open surgery  has a longer recovery time. AFTER THE PROCEDURE  You will be taken to the recovery area where a nurse will watch and check your progress.   You may be allowed to go home the same day.   Do not resume physical activities until directed by your caregiver.   You may resume a normal  diet and activities as directed.  Document Released: 10/23/2005 Document Revised: 10/12/2011 Document Reviewed: 04/07/2011 [facility] Patient Information 2012 [place], [place].

## 2012-07-17 ENCOUNTER — Encounter (HOSPITAL_COMMUNITY): Payer: Self-pay | Admitting: Pharmacy Technician

## 2012-07-25 ENCOUNTER — Encounter (HOSPITAL_COMMUNITY)
Admission: RE | Admit: 2012-07-25 | Discharge: 2012-07-25 | Disposition: A | Discharge status: Home / Self Care | Payer: Medicaid Other | Source: Ambulatory Visit | Attending: General Surgery | Admitting: General Surgery

## 2012-07-25 ENCOUNTER — Encounter (HOSPITAL_COMMUNITY): Payer: Self-pay

## 2012-07-25 HISTORY — DX: Panic disorder (episodic paroxysmal anxiety): F41.0

## 2012-07-25 HISTORY — DX: Constipation, unspecified: K59.00

## 2012-07-25 HISTORY — DX: Other seasonal allergic rhinitis: J30.2

## 2012-07-25 HISTORY — DX: Carpal tunnel syndrome, bilateral upper limbs: G56.03

## 2012-07-25 LAB — BASIC METABOLIC PANEL
Calcium: 10.2 mg/dL (ref 8.4–10.5)
Creatinine, Ser: 0.69 mg/dL (ref 0.50–1.10)
GFR calc Af Amer: >90 mL/min (ref >90)
Sodium: 135 mEq/L (ref 135–145)

## 2012-07-25 LAB — CBC
Platelets: 455 10*3/uL — ABNORMAL HIGH (ref 150–400)
RBC: 5 MIL/uL (ref 3.87–5.11)
RDW: 12.5 % (ref 11.5–15.5)
WBC: 9 10*3/uL (ref 4.0–10.5)

## 2012-07-25 LAB — HCG, SERUM, QUALITATIVE: Preg, Serum: NEGATIVE

## 2012-07-25 LAB — SURGICAL PCR SCREEN
MRSA, PCR: NEGATIVE
Staphylococcus aureus: POSITIVE — AB

## 2012-07-25 NOTE — Progress Notes (Signed)
Patient denied having a stress test, cardiac cath, or sleep study. Patient does not have a PCP but has a therapist at [facility] in [place], [place]. LOV was Monday. Patient unsure of therapist full name but thinks her first name is [place].

## 2012-07-25 NOTE — Pre-Procedure Instructions (Signed)
20 Tiffany Ho  07/25/2012   Your procedure is scheduled on:  Monday July 29, 2012.  Report to [facility] at 0945 AM.  Call this number if you have problems the morning of surgery: [phone]   Remember:   Do not eat food or drink:After Midnight.    Take these medicines the morning of surgery with A SIP OF WATER: Citalopram (Celexa), Ortho-tricyclen, Ondansetron (Zofran) if needed for nausea, and Oxycodone (Percocet) if needed for pain.   Do not wear jewelry, make-up or nail polish.  Do not wear lotions, powders, or perfumes.   Do not shave 48 hours prior to surgery.   Do not bring valuables to the hospital.  Contacts, dentures or bridgework may not be worn into surgery.  Leave suitcase in the car. After surgery it may be brought to your room.  For patients admitted to the hospital, checkout time is 11:00 AM the day of discharge.   Patients discharged the day of surgery will not be allowed to drive home.  Name and phone number of your driver:   Special Instructions: Please shower with CHG on Saturday night and Sunday night before surgery  Please read over the following fact sheets that you were given: Pain Booklet, Coughing and Deep Breathing, MRSA Information and Surgical Site Infection Prevention

## 2012-07-26 NOTE — Progress Notes (Signed)
Notified patient of time change and she needs to arrive at 0730, 07/29/12.

## 2012-07-26 NOTE — H&P (Signed)
Tiffany Ho     MRN: [id number]   Description: 31 year old female  Provider: Ernestene Mention, MD  Department: [facility]      Diagnoses     Gallstones   - Primary    574.20       Vitals      BP Pulse Temp Resp Ht Wt    124/80 80 98.2 F (36.8 C) 16 5\' 4"  (1.626 m) 183 lb (83.008 kg)    BMI LMP - 31.41 kg/m2 06/14/2012               History and Physical     Ernestene Mention, MD   Patient ID: Tiffany Ho, female   DOB: 05-06-1981, 31 y.o.   MRN: [id number]                HPI Tiffany Ho is a 31 y.o. female.  She is referred by Dr. Dierdre Highman in the measures for emergent evaluation of symptomatic gallstones.   The patient states he has had mild episodes of indigestion and upper abdominal discomfort for at least 2 months. This past weekend she had 2 episodes of severe pain in the right upper quadrant radiating to the right flank with associated nausea. No vomiting. No fever. No change in bowel habits. Chronically constipated. Some diaphoresis.   She went to the [facility] emergency room and was diagnosed with gallstones, saw a Careers adviser, and was discharged home. The pain came back and she went to [facility] 24 hours later. She was given the same diagnosis and discharged home since her lab work showed no acute findings.   He was worked into my office today. She states that she still has some discomfort but the pain is not severe. She is frustrated that she has had to wait for surgery.     Past Medical History   Diagnosis  Date   .  Gallstones     .  Anxiety         Past Surgical History   Procedure  Date   .  Cesarean section        No family history on file.   Social History History   Substance Use Topics   .  Smoking status:  Never Smoker    .  Smokeless tobacco:  Not on file   .  Alcohol Use:  No       Allergies   Allergen  Reactions   .  Vicodin (Hydrocodone-Acetaminophen)  Itching       Current Outpatient  Prescriptions   Medication  Sig  Dispense  Refill   .  citalopram (CELEXA) 20 MG tablet  Take 20 mg by mouth daily.         .  Norgestimate-Ethinyl Estradiol Triphasic (ORTHO TRI-CYCLEN LO) 0.18/0.215/0.25 MG-25 MCG tab  Take 1 tablet by mouth daily.         .  ondansetron (ZOFRAN) 4 MG tablet  Take 1 tablet (4 mg total) by mouth every 6 (six) hours.   12 tablet   0   .  ondansetron (ZOFRAN) 4 MG tablet  Take 1 tablet (4 mg total) by mouth every 6 (six) hours.   12 tablet   0   .  oxyCODONE-acetaminophen (PERCOCET/ROXICET) 5-325 MG per tablet  Take 1 tablet by mouth every 6 (six) hours as needed for pain.   15 tablet   0      Review of Systems   Constitutional:  Negative for fever, chills and unexpected weight change.  HENT: Negative for hearing loss, congestion, sore throat, trouble swallowing and voice change.   Eyes: Negative for visual disturbance.  Respiratory: Positive for cough. Negative for wheezing.   Cardiovascular: Negative for chest pain, palpitations and leg swelling.  Gastrointestinal: Positive for nausea, abdominal pain and constipation. Negative for vomiting, diarrhea, blood in stool, abdominal distention and anal bleeding.  Genitourinary: Negative for hematuria, vaginal bleeding and difficulty urinating.  Musculoskeletal: Negative for arthralgias.  Skin: Negative for rash and wound.  Neurological: Negative for seizures, syncope and headaches.  Hematological: Negative for adenopathy. Does not bruise/bleed easily.  Psychiatric/Behavioral: Negative for confusion.    Blood pressure 124/80, pulse 80, temperature 98.2 F (36.8 C), resp. rate 16, height 5\' 4"  (1.626 m), weight 183 lb (83.008 kg), last menstrual period 06/14/2012.   Physical Exam  Constitutional: She is oriented to person, place, and time. She appears well-developed and well-nourished. No distress.       BMI 31.41  HENT:   Head: Normocephalic and atraumatic.   Nose: Nose normal.   Mouth/Throat: No  oropharyngeal exudate.  Eyes: Conjunctivae and EOM are normal. Pupils are equal, round, and reactive to light. Left eye exhibits no discharge. No scleral icterus.  Neck: Neck supple. No JVD present. No tracheal deviation present. No thyromegaly present.  Cardiovascular: Normal rate, regular rhythm, normal heart sounds and intact distal pulses.    No murmur heard. Pulmonary/Chest: Effort normal and breath sounds normal. No respiratory distress. She has no wheezes. She has no rales. She exhibits no tenderness.  Abdominal: Soft. Bowel sounds are normal. She exhibits no distension and no mass. There is no tenderness. There is no rebound and no guarding.       Subjectively tender right upper quadrant. Objectively soft, nondistended, no mass. No guarding.  Musculoskeletal: She exhibits no edema and no tenderness.  Lymphadenopathy:    She has no cervical adenopathy.  Neurological: She is alert and oriented to person, place, and time. She exhibits normal muscle tone. Coordination normal.  Skin: Skin is warm. No rash noted. She is not diaphoretic. No erythema. No pallor.  Psychiatric: She has a normal mood and affect. Her behavior is normal. Judgment and thought content normal.    Data Reviewed Emergency department notes, labs, ultrasound.   Assessment Chronic cholecystitis with cholelithiasis. Now with accelerating biliary colic.   Mild obesity   Multiparity   Status post cesarean section   Depression, on citalopram.   Plan Lowfat diet advised.   Schedule for laparoscopic cholecystectomy with cholangiogram.   I discussed the indications, details, techniques, and numerous risk of the surgery with the patient. She has been given written information with diagrams. She understands these issues. Her questions were answered. She agrees with this plan.       Angelia Mould. Derrell Lolling, M.D., [facility] General and Minimally invasive Surgery Breast and Colorectal  Surgery Office:   [phone] Pager:   [phone]

## 2012-07-28 MED ORDER — CHLORHEXIDINE GLUCONATE 4 % EX LIQD: 1.0000 "application " | Freq: Once | CUTANEOUS | Status: DC

## 2012-07-28 MED ORDER — DEXTROSE 5 % IV SOLN
3.0000 g | INTRAVENOUS | Status: AC
  Administered 2012-07-29: 3 g via INTRAVENOUS
  Filled 2012-07-28: qty 3000

## 2012-07-29 ENCOUNTER — Encounter (HOSPITAL_COMMUNITY): Payer: Self-pay | Admitting: Anesthesiology

## 2012-07-29 ENCOUNTER — Encounter (HOSPITAL_COMMUNITY)
Admission: RE | Disposition: A | Discharge status: Home / Self Care | Payer: Self-pay | Source: Ambulatory Visit | Attending: General Surgery

## 2012-07-29 ENCOUNTER — Ambulatory Visit (HOSPITAL_COMMUNITY): Payer: Medicaid Other | Admitting: Anesthesiology

## 2012-07-29 ENCOUNTER — Ambulatory Visit (HOSPITAL_COMMUNITY): Payer: Medicaid Other

## 2012-07-29 ENCOUNTER — Encounter (HOSPITAL_COMMUNITY): Payer: Self-pay | Admitting: *Deleted

## 2012-07-29 ENCOUNTER — Ambulatory Visit (HOSPITAL_COMMUNITY)
Admission: RE | Admit: 2012-07-29 | Discharge: 2012-07-29 | Disposition: A | Discharge status: Home / Self Care | Payer: Medicaid Other | Source: Ambulatory Visit | Attending: General Surgery | Admitting: General Surgery

## 2012-07-29 DIAGNOSIS — K8010 Calculus of gallbladder with chronic cholecystitis without obstruction: Secondary | ICD-10-CM

## 2012-07-29 DIAGNOSIS — K8020 Calculus of gallbladder without cholecystitis without obstruction: Secondary | ICD-10-CM | POA: Diagnosis present

## 2012-07-29 DIAGNOSIS — Z01812 Encounter for preprocedural laboratory examination: Secondary | ICD-10-CM | POA: Insufficient documentation

## 2012-07-29 HISTORY — PX: CHOLECYSTECTOMY: SHX55

## 2012-07-29 SURGERY — LAPAROSCOPIC CHOLECYSTECTOMY WITH INTRAOPERATIVE CHOLANGIOGRAM
Anesthesia: General | Site: Abdomen | Wound class: Clean Contaminated

## 2012-07-29 MED ORDER — FENTANYL CITRATE 0.05 MG/ML IJ SOLN
INTRAMUSCULAR | Status: DC | PRN
  Administered 2012-07-29 (×5): 50 ug via INTRAVENOUS

## 2012-07-29 MED ORDER — ACETAMINOPHEN 10 MG/ML IV SOLN
1000.0000 mg | Freq: Once | INTRAVENOUS | Status: AC | PRN
  Administered 2012-07-29: 1000 mg via INTRAVENOUS

## 2012-07-29 MED ORDER — BUPIVACAINE-EPINEPHRINE 0.25% -1:200000 IJ SOLN
INTRAMUSCULAR | Status: DC | PRN
  Administered 2012-07-29: 16 mL

## 2012-07-29 MED ORDER — KETOROLAC TROMETHAMINE 30 MG/ML IJ SOLN
INTRAMUSCULAR | Status: DC | PRN
  Administered 2012-07-29: 30 mg via INTRAVENOUS

## 2012-07-29 MED ORDER — PROPOFOL 10 MG/ML IV BOLUS
INTRAVENOUS | Status: DC | PRN
  Administered 2012-07-29: 150 mg via INTRAVENOUS

## 2012-07-29 MED ORDER — OXYCODONE-ACETAMINOPHEN 7.5-325 MG PO TABS: 1.0000 | ORAL_TABLET | ORAL | Status: DC | PRN

## 2012-07-29 MED ORDER — 0.9 % SODIUM CHLORIDE (POUR BTL) OPTIME
TOPICAL | Status: DC | PRN
  Administered 2012-07-29: 1000 mL

## 2012-07-29 MED ORDER — DEXAMETHASONE SODIUM PHOSPHATE 4 MG/ML IJ SOLN
INTRAMUSCULAR | Status: DC | PRN
  Administered 2012-07-29: 4 mg via INTRAVENOUS

## 2012-07-29 MED ORDER — ROCURONIUM BROMIDE 100 MG/10ML IV SOLN
INTRAVENOUS | Status: DC | PRN
  Administered 2012-07-29: 10 mg via INTRAVENOUS
  Administered 2012-07-29: 40 mg via INTRAVENOUS

## 2012-07-29 MED ORDER — LACTATED RINGERS IV SOLN
INTRAVENOUS | Status: DC
  Administered 2012-07-29: 09:00:00 via INTRAVENOUS

## 2012-07-29 MED ORDER — SODIUM CHLORIDE 0.9 % IR SOLN
Status: DC | PRN
  Administered 2012-07-29: 1

## 2012-07-29 MED ORDER — ACETAMINOPHEN 10 MG/ML IV SOLN
INTRAVENOUS | Status: AC
  Filled 2012-07-29: qty 100

## 2012-07-29 MED ORDER — SODIUM CHLORIDE 0.9 % IV SOLN
INTRAVENOUS | Status: DC | PRN
  Administered 2012-07-29: 11:00:00

## 2012-07-29 MED ORDER — ONDANSETRON HCL 4 MG/2ML IJ SOLN
INTRAMUSCULAR | Status: DC | PRN
  Administered 2012-07-29: 4 mg via INTRAVENOUS

## 2012-07-29 MED ORDER — NEOSTIGMINE METHYLSULFATE 1 MG/ML IJ SOLN
INTRAMUSCULAR | Status: DC | PRN
  Administered 2012-07-29: 3 mg via INTRAVENOUS

## 2012-07-29 MED ORDER — OXYCODONE-ACETAMINOPHEN 5-325 MG PO TABS
1.0000 | ORAL_TABLET | ORAL | Status: DC | PRN
  Administered 2012-07-29: 1 via ORAL
  Filled 2012-07-29: qty 1

## 2012-07-29 MED ORDER — LACTATED RINGERS IV SOLN
INTRAVENOUS | Status: DC | PRN
  Administered 2012-07-29: 09:00:00 via INTRAVENOUS

## 2012-07-29 MED ORDER — LIDOCAINE HCL (CARDIAC) 20 MG/ML IV SOLN
INTRAVENOUS | Status: DC | PRN
  Administered 2012-07-29: 50 mg via INTRAVENOUS

## 2012-07-29 MED ORDER — BUPIVACAINE-EPINEPHRINE PF 0.25-1:200000 % IJ SOLN
INTRAMUSCULAR | Status: AC
  Filled 2012-07-29: qty 30

## 2012-07-29 MED ORDER — GLYCOPYRROLATE 0.2 MG/ML IJ SOLN
INTRAMUSCULAR | Status: DC | PRN
  Administered 2012-07-29: 0.4 mg via INTRAVENOUS

## 2012-07-29 MED ORDER — ONDANSETRON HCL 4 MG/2ML IJ SOLN
4.0000 mg | Freq: Once | INTRAMUSCULAR | Status: AC | PRN
  Administered 2012-07-29: 4 mg via INTRAVENOUS
  Filled 2012-07-29: qty 2

## 2012-07-29 MED ORDER — MIDAZOLAM HCL 5 MG/5ML IJ SOLN
INTRAMUSCULAR | Status: DC | PRN
  Administered 2012-07-29: 2 mg via INTRAVENOUS

## 2012-07-29 SURGICAL SUPPLY — 45 items
ADH SKN CLS APL DERMABOND .7 (GAUZE/BANDAGES/DRESSINGS)
APPLIER CLIP ROT 10 11.4 M/L (STAPLE) ×2
APR CLP MED LRG 11.4X10 (STAPLE) ×1
BAG SPEC RTRVL LRG 6X4 10 (ENDOMECHANICALS) ×1
BLADE SURG ROTATE 9660 (MISCELLANEOUS) IMPLANT
CANISTER SUCTION 2500CC (MISCELLANEOUS) ×2 IMPLANT
CHLORAPREP W/TINT 26ML (MISCELLANEOUS) ×2 IMPLANT
CLIP APPLIE ROT 10 11.4 M/L (STAPLE) ×1 IMPLANT
CLOTH BEACON ORANGE TIMEOUT ST (SAFETY) ×2 IMPLANT
COVER MAYO STAND STRL (DRAPES) ×2 IMPLANT
COVER SURGICAL LIGHT HANDLE (MISCELLANEOUS) ×2 IMPLANT
DECANTER SPIKE VIAL GLASS SM (MISCELLANEOUS) ×2 IMPLANT
DERMABOND ADVANCED (GAUZE/BANDAGES/DRESSINGS)
DERMABOND ADVANCED .7 DNX12 (GAUZE/BANDAGES/DRESSINGS) ×1 IMPLANT
DRAPE C-ARM 42X72 X-RAY (DRAPES) ×2 IMPLANT
DRAPE UTILITY 15X26 W/TAPE STR (DRAPE) ×4 IMPLANT
ELECT REM PT RETURN 9FT ADLT (ELECTROSURGICAL) ×2
ELECTRODE REM PT RTRN 9FT ADLT (ELECTROSURGICAL) ×1 IMPLANT
GLOVE BIO SURGEON STRL SZ7.5 (GLOVE) ×1 IMPLANT
GLOVE BIOGEL PI IND STRL 7.0 (GLOVE) IMPLANT
GLOVE BIOGEL PI IND STRL 7.5 (GLOVE) IMPLANT
GLOVE BIOGEL PI INDICATOR 7.0 (GLOVE) ×1
GLOVE BIOGEL PI INDICATOR 7.5 (GLOVE) ×1
GLOVE EUDERMIC 7 POWDERFREE (GLOVE) ×2 IMPLANT
GLOVE SS BIOGEL STRL SZ 6.5 (GLOVE) IMPLANT
GLOVE SUPERSENSE BIOGEL SZ 6.5 (GLOVE) ×1
GOWN PREVENTION PLUS XLARGE (GOWN DISPOSABLE) ×3 IMPLANT
GOWN STRL NON-REIN LRG LVL3 (GOWN DISPOSABLE) ×6 IMPLANT
KIT BASIN OR (CUSTOM PROCEDURE TRAY) ×2 IMPLANT
KIT ROOM TURNOVER OR (KITS) ×2 IMPLANT
NS IRRIG 1000ML POUR BTL (IV SOLUTION) ×2 IMPLANT
PAD ARMBOARD 7.5X6 YLW CONV (MISCELLANEOUS) ×2 IMPLANT
POUCH SPECIMEN RETRIEVAL 10MM (ENDOMECHANICALS) ×2 IMPLANT
SCISSORS LAP 5X35 DISP (ENDOMECHANICALS) IMPLANT
SET CHOLANGIOGRAPH 5 50 .035 (SET/KITS/TRAYS/PACK) ×2 IMPLANT
SET IRRIG TUBING LAPAROSCOPIC (IRRIGATION / IRRIGATOR) ×2 IMPLANT
SLEEVE ENDOPATH XCEL 5M (ENDOMECHANICALS) ×2 IMPLANT
SPECIMEN JAR SMALL (MISCELLANEOUS) ×2 IMPLANT
SUT MNCRL AB 4-0 PS2 18 (SUTURE) ×2 IMPLANT
TOWEL OR 17X24 6PK STRL BLUE (TOWEL DISPOSABLE) ×2 IMPLANT
TOWEL OR 17X26 10 PK STRL BLUE (TOWEL DISPOSABLE) ×2 IMPLANT
TRAY LAPAROSCOPIC (CUSTOM PROCEDURE TRAY) ×2 IMPLANT
TROCAR XCEL BLUNT TIP 100MML (ENDOMECHANICALS) ×2 IMPLANT
TROCAR XCEL NON-BLD 11X100MML (ENDOMECHANICALS) ×2 IMPLANT
TROCAR XCEL NON-BLD 5MMX100MML (ENDOMECHANICALS) ×2 IMPLANT

## 2012-07-29 NOTE — Interval H&P Note (Signed)
History and Physical Interval Note:  07/29/2012 9:21 AM  Tiffany Ho  has presented today for surgery, with the diagnosis of gallstones  The goals and the various methods of treatment have been discussed with the patient and family. After consideration of risks, benefits and other options for treatment, the patient has consented to  Procedure(s) (LRB) with comments: LAPAROSCOPIC CHOLECYSTECTOMY WITH INTRAOPERATIVE CHOLANGIOGRAM (N/A) as a surgical intervention .  The patient's history has been reviewed, patient examined, no change in status, stable for surgery.  I have reviewed the patient's chart and labs.  Questions were answered to the patient's satisfaction.     Angelia Mould. Derrell Lolling, M.D., [facility] General and Minimally invasive Surgery Breast and Colorectal Surgery Office:   [phone] Pager:   [phone]

## 2012-07-29 NOTE — Anesthesia Postprocedure Evaluation (Signed)
  Anesthesia Post-op Note  Patient: Tiffany Ho  Procedure(s) Performed: Procedure(s) (LRB) with comments: LAPAROSCOPIC CHOLECYSTECTOMY WITH INTRAOPERATIVE CHOLANGIOGRAM (N/A)  Patient Location: PACU  Anesthesia Type: General  Level of Consciousness: awake, alert  and oriented  Airway and Oxygen Therapy: Patient Spontanous Breathing and Patient connected to nasal cannula oxygen  Post-op Pain: mild  Post-op Assessment: Post-op Vital signs reviewed and Patient's Cardiovascular Status Stable  Post-op Vital Signs: stable  Complications: No apparent anesthesia complications

## 2012-07-29 NOTE — Transfer of Care (Signed)
Immediate Anesthesia Transfer of Care Note  Patient: Tiffany Ho  Procedure(s) Performed: Procedure(s) (LRB) with comments: LAPAROSCOPIC CHOLECYSTECTOMY WITH INTRAOPERATIVE CHOLANGIOGRAM (N/A)  Patient Location: PACU  Anesthesia Type: General  Level of Consciousness: awake, alert , oriented and sedated  Airway & Oxygen Therapy: Patient Spontanous Breathing and Patient connected to nasal cannula oxygen  Post-op Assessment: Report given to PACU RN, Post -op Vital signs reviewed and stable and Patient moving all extremities  Post vital signs: Reviewed and stable  Complications: No apparent anesthesia complications

## 2012-07-29 NOTE — Anesthesia Preprocedure Evaluation (Signed)
Anesthesia Evaluation  Patient identified by MRN, date of birth, ID band Patient awake    Reviewed: Allergy & Precautions, H&P , NPO status , Patient's Chart, lab work & pertinent test results  Airway Mallampati: II      Dental  (+) Teeth Intact   Pulmonary  breath sounds clear to auscultation        Cardiovascular Rhythm:Regular Rate:Normal     Neuro/Psych    GI/Hepatic   Endo/Other    Renal/GU      Musculoskeletal   Abdominal   Peds  Hematology   Anesthesia Other Findings   Reproductive/Obstetrics                           Anesthesia Physical Anesthesia Plan  ASA: II  Anesthesia Plan: General   Post-op Pain Management:    Induction: Intravenous  Airway Management Planned: Oral ETT  Additional Equipment:   Intra-op Plan:   Post-operative Plan: Extubation in OR  Informed Consent: I have reviewed the patients History and Physical, chart, labs and discussed the procedure including the risks, benefits and alternatives for the proposed anesthesia with the patient or authorized representative who has indicated his/her understanding and acceptance.   Dental advisory given  Plan Discussed with:   Anesthesia Plan Comments: (Symptomatic cholelithiasis   Plan GA)        Anesthesia Quick Evaluation

## 2012-07-29 NOTE — Op Note (Signed)
Patient Name:           Tiffany Ho   Date of Surgery:        07/29/2012  Pre op Diagnosis:      Chronic cholecystitis with cholelithiasis  Post op Diagnosis:    same  Procedure:                 Laparoscopic cholecystectomy with intraoperative cholangiogram  Surgeon:                     Angelia Mould. Derrell Lolling, M.D., FACS  Assistant:                      Gaynelle Adu, M.D.   Operative Indications:   Tiffany Ho is a 31 y.o. female. She is referred by Dr. Dierdre Highman, EDP for evaluation of symptomatic gallstones.  The patient states she has had mild episodes of indigestion and upper abdominal discomfort for at least 2 months. This past weekend she had 2 episodes of severe pain in the right upper quadrant radiating to the right flank with associated nausea. No vomiting. No fever. No change in bowel habits. Chronically constipated. Some diaphoresis.  She went to the [facility] emergency room and was diagnosed with gallstones, saw a Careers adviser, and was discharged home. The pain came back and she went to [facility] 24 hours later. She was given the same diagnosis and discharged home since her lab work showed no acute findings.I have evaluated and counseled this patient in my office and she is brought to the operating room electively.   Operative Findings:       The gallbladder was chronically inflamed and contained numerous gallstones. There were some adhesions to the body and infundibulum of the gallbladder. The anatomy of the cystic duct, cystic artery and common bile duct were conventional. The intraoperative cholangiogram was normal, showing normal intrahepatic and extrahepatic biliary anatomy, no dilatation, no filling defect, and no obstruction  with good flow of contrast into the duodenum. The liver, stomach, duodenum, small intestine, and large intestine were grossly normal to inspection.  Procedure in Detail:          Following the induction of general endotracheal anesthesia the patient's  abdomen was prepped and draped in a sterile fashion, intravenous antibiotics were given and a surgical time out was performed. 0.5% Marcaine with epinephrine was used as a local infiltration anesthetic. A vertical incision was made inside the lower rim of the umbilicus and an 11 mm Hassan trocar was inserted with an open technique. Pursestring suture of 0 Vicryl was placed. Pneumoperitoneum was created. Video camera was inserted. An 11 mm trocar was placed in the subxiphoid region two 5 mm trocars were  placed in the right upper quadrant. The gallbladder fundus was elevated. Adhesions were taken down off of the lower gallbladder allowing lateral retraction of the infundibulum. I dissected out a large anterior branch the cystic artery, isolated it as it went on the wall the gallbladder, secured it with multiple metal clips and divided it.   I  Isolated the cystic duct and inserted a cholangiocatheter, performed a cholangiogram using the C-arm. The cholangiogram was normal as described above. I removed the cholangiocatheter, secured the cystic duct with numerous metal clips and divided it. We isolated the posterior branch of the cystic artery and secured it with hemoclips as well. The gallbladder was dissected from its bed with electrocautery, placed in a specimen bag and removed. The  operative field and subphrenic space were copiously irrigated with saline and there was no bleeding or bile leak. Irrigation fluid became completely clear. The trocars were removed under direct vision. There was no bleeding from the trocar sites. The pneumoperitoneum was released. The fascia at the umbilicus was closed with 0 Vicryl sutures. Skin incisions were closed with subcuticular sutures of 4-0 Monocryl and Dermabond. The patient tolerated the procedure well. She was taken to recovery in stable condition. There were no complications. EBL 10 cc. Counts correct.     Angelia Mould. Derrell Lolling, M.D., FACS General and Minimally Invasive  Surgery Breast and Colorectal Surgery  07/29/2012 11:15 AM

## 2012-07-29 NOTE — Preoperative (Signed)
Beta Blockers   Reason not to administer Beta Blockers:Not Applicable 

## 2012-07-30 ENCOUNTER — Encounter (HOSPITAL_COMMUNITY): Payer: Self-pay | Admitting: General Surgery

## 2012-08-19 ENCOUNTER — Ambulatory Visit (INDEPENDENT_AMBULATORY_CARE_PROVIDER_SITE_OTHER): Payer: Medicaid Other | Admitting: General Surgery

## 2012-08-19 ENCOUNTER — Encounter (INDEPENDENT_AMBULATORY_CARE_PROVIDER_SITE_OTHER): Payer: Self-pay | Admitting: General Surgery

## 2012-08-19 VITALS — BP 116/64 | HR 92 | Temp 97.7°F | Resp 16 | Ht 64.0 in | Wt 190.2 lb

## 2012-08-19 DIAGNOSIS — K8020 Calculus of gallbladder without cholecystitis without obstruction: Secondary | ICD-10-CM

## 2012-08-19 NOTE — Patient Instructions (Signed)
You have recovered from your gallbladder surgery without any obvious surgical problems.  You are advised to follow a high-fiber, low-fat diet and drink lots of water.  You may resume normal physical activities.  Return to see Dr. Derrell Lolling if further problems arise

## 2012-08-19 NOTE — Progress Notes (Signed)
Patient ID: Tiffany Ho, female   DOB: 16-Nov-1980, 31 y.o.   MRN: [id number] This 31 year old woman underwent laparoscopic cholecystectomy with cholangiogram on September 23. Final pathology report shows chronic cholecystitis with cholelithiasis. She had little bit of indigestion the first week but is doing fine now. No nausea. No diarrhea. No fevers. No wound problems.  On exam, the patient was well. Abdomen is soft and nontender. All trocar sites are well healed. No hernias.  Assessment and plan: Uneventful recovery following laparoscopic cholecystectomy with cholangiogram. High-fiber low-fat diet with hydration advised. Resume normal activities Return to see me when necessary.    Angelia Mould. Derrell Lolling, M.D., [facility] General and Minimally invasive Surgery Breast and Colorectal Surgery Office:   [phone] Pager:   [phone]

## 2013-06-23 ENCOUNTER — Other Ambulatory Visit (HOSPITAL_COMMUNITY): Payer: Self-pay | Admitting: Nurse Practitioner

## 2013-06-23 DIAGNOSIS — R9230 Dense breasts, unspecified: Secondary | ICD-10-CM

## 2013-06-23 DIAGNOSIS — R922 Inconclusive mammogram: Secondary | ICD-10-CM

## 2013-06-23 DIAGNOSIS — IMO0002 Reserved for concepts with insufficient information to code with codable children: Secondary | ICD-10-CM

## 2013-07-02 ENCOUNTER — Other Ambulatory Visit (HOSPITAL_COMMUNITY): Payer: Self-pay | Admitting: Nurse Practitioner

## 2013-07-02 ENCOUNTER — Ambulatory Visit (HOSPITAL_COMMUNITY)
Admission: RE | Admit: 2013-07-02 | Discharge: 2013-07-02 | Disposition: A | Discharge status: Home / Self Care | Payer: Medicaid Other | Source: Ambulatory Visit | Attending: Nurse Practitioner | Admitting: Nurse Practitioner

## 2013-07-02 DIAGNOSIS — R9230 Dense breasts, unspecified: Secondary | ICD-10-CM

## 2013-07-02 DIAGNOSIS — R922 Inconclusive mammogram: Secondary | ICD-10-CM

## 2013-07-02 DIAGNOSIS — N63 Unspecified lump in unspecified breast: Secondary | ICD-10-CM | POA: Insufficient documentation

## 2013-07-02 DIAGNOSIS — IMO0002 Reserved for concepts with insufficient information to code with codable children: Secondary | ICD-10-CM

## 2013-07-14 ENCOUNTER — Encounter: Payer: Self-pay | Admitting: General Practice

## 2013-07-14 ENCOUNTER — Ambulatory Visit (INDEPENDENT_AMBULATORY_CARE_PROVIDER_SITE_OTHER): Payer: Medicaid Other | Admitting: General Practice

## 2013-07-14 VITALS — BP 120/80 | HR 81 | Temp 97.6°F | Ht 64.0 in | Wt 193.0 lb

## 2013-07-14 DIAGNOSIS — G4700 Insomnia, unspecified: Secondary | ICD-10-CM

## 2013-07-14 DIAGNOSIS — F411 Generalized anxiety disorder: Secondary | ICD-10-CM

## 2013-07-14 DIAGNOSIS — F329 Major depressive disorder, single episode, unspecified: Secondary | ICD-10-CM

## 2013-07-14 MED ORDER — PAROXETINE HCL 20 MG PO TABS: 20.0000 mg | ORAL_TABLET | ORAL | Status: DC

## 2013-07-14 MED ORDER — HYDROXYZINE HCL 50 MG PO TABS: ORAL_TABLET | ORAL | Status: DC
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
## 2013-07-14 NOTE — Patient Instructions (Addendum)
Depression, Adult Depression refers to feeling sad, low, down in the dumps, blue, gloomy, or empty. In general, there are two kinds of depression: 1. Depression that we all experience from time to time because of upsetting life experiences, including the loss of a job or the ending of a relationship (normal sadness or normal grief). This kind of depression is considered normal, is short lived, and resolves within a few days to 2 weeks. (Depression experienced after the loss of a loved one is called bereavement. Bereavement often lasts longer than 2 weeks but normally gets better with time.) 2. Clinical depression, which lasts longer than normal sadness or normal grief or interferes with your ability to function at home, at work, and in school. It also interferes with your personal relationships. It affects almost every aspect of your life. Clinical depression is an illness. Symptoms of depression also can be caused by conditions other than normal sadness and grief or clinical depression. Examples of these conditions are listed as follows:  Physical illness Some physical illnesses, including underactive thyroid gland (hypothyroidism), severe anemia, specific types of cancer, diabetes, uncontrolled seizures, heart and lung problems, strokes, and chronic pain are commonly associated with symptoms of depression.  Side effects of some prescription medicine In some people, certain types of prescription medicine can cause symptoms of depression.  Substance abuse Abuse of alcohol and illicit drugs can cause symptoms of depression. SYMPTOMS Symptoms of normal sadness and normal grief include the following:  Feeling sad or crying for short periods of time.  Not caring about anything (apathy).  Difficulty sleeping or sleeping too much.  No longer able to enjoy the things you used to enjoy.  Desire to be by oneself all the time (social isolation).  Lack of energy or motivation.  Difficulty  concentrating or remembering.  Change in appetite or weight.  Restlessness or agitation. Symptoms of clinical depression include the same symptoms of normal sadness or normal grief and also the following symptoms:  Feeling sad or crying all the time.  Feelings of guilt or worthlessness.  Feelings of hopelessness or helplessness.  Thoughts of suicide or the desire to harm yourself (suicidal ideation).  Loss of touch with reality (psychotic symptoms). Seeing or hearing things that are not real (hallucinations) or having false beliefs about your life or the people around you (delusions and paranoia). DIAGNOSIS  The diagnosis of clinical depression usually is based on the severity and duration of the symptoms. Your caregiver also will ask you questions about your medical history and substance use to find out if physical illness, use of prescription medicine, or substance abuse is causing your depression. Your caregiver also may order blood tests. TREATMENT  Typically, normal sadness and normal grief do not require treatment. However, sometimes antidepressant medicine is prescribed for bereavement to ease the depressive symptoms until they resolve. The treatment for clinical depression depends on the severity of your symptoms but typically includes antidepressant medicine, counseling with a mental health professional, or a combination of both. Your caregiver will help to determine what treatment is best for you. Depression caused by physical illness usually goes away with appropriate medical treatment of the illness. If prescription medicine is causing depression, talk with your caregiver about stopping the medicine, decreasing the dose, or substituting another medicine. Depression caused by abuse of alcohol or illicit drugs abuse goes away with abstinence from these substances. Some adults need professional help in order to stop drinking or using drugs. SEEK IMMEDIATE CARE IF:  You have   thoughts  about hurting yourself or others.  You lose touch with reality (have psychotic symptoms).  You are taking medicine for depression and have a serious side effect. FOR MORE INFORMATION National Alliance on Mental Illness: www.nami.[employer]: http://www.maynard.net/ Document Released: 10/20/2000 Document Revised: 04/23/2012 Document Reviewed: 01/22/2012 [facility] Patient Information 2014 [place], [place].   Tapering of Celexa  -Taper of celexa on Monday, Wed, and Saturday on week one -on second week take celexa on Tuesday, Thursday  -on days not taking celexa, take paxil -on week four take paxil daily, discontinue celexa

## 2013-07-21 ENCOUNTER — Ambulatory Visit (INDEPENDENT_AMBULATORY_CARE_PROVIDER_SITE_OTHER): Payer: Medicaid Other | Admitting: Nurse Practitioner

## 2013-07-21 ENCOUNTER — Encounter: Payer: Self-pay | Admitting: Nurse Practitioner

## 2013-07-21 VITALS — BP 118/78 | HR 83 | Temp 98.4°F | Ht 64.0 in | Wt 195.0 lb

## 2013-07-21 DIAGNOSIS — F411 Generalized anxiety disorder: Secondary | ICD-10-CM

## 2013-07-21 MED ORDER — VILAZODONE HCL 10 & 20 & 40 MG PO KIT: 1.0000 | PACK | Freq: Every day | ORAL | Status: DC

## 2013-07-21 NOTE — Patient Instructions (Addendum)

## 2013-07-21 NOTE — Progress Notes (Signed)
  Subjective:    Patient ID: Tiffany Ho, female    DOB: 1981-03-29, 32 y.o.   MRN: [id number]  HPI patient saw Tiffany Ho on 07/14/13- She was on celexa and it has not been helping her- so mae swtiched her to paxil 27- she is still  Not doing well- Very anxious and on edge- Wants to cry a lot- gets upset very easily- very Snappy.    Review of Systems  All other systems reviewed and are negative.       Objective:   Physical Exam  Constitutional: She appears well-developed and well-nourished.  Cardiovascular: Normal rate, regular rhythm and normal heart sounds.   Pulmonary/Chest: Effort normal and breath sounds normal.  Psychiatric: She has a normal mood and affect. Her behavior is normal. Judgment and thought content normal.  Very anxious          Assessment & Plan:

## 2013-11-17 ENCOUNTER — Emergency Department (HOSPITAL_COMMUNITY)
Admission: EM | Admit: 2013-11-17 | Discharge: 2013-11-17 | Disposition: A | Discharge status: Home / Self Care | Payer: Medicaid Other | Attending: Emergency Medicine | Admitting: Emergency Medicine

## 2013-11-17 ENCOUNTER — Encounter (HOSPITAL_COMMUNITY): Payer: Self-pay | Admitting: Emergency Medicine

## 2013-11-17 DIAGNOSIS — M542 Cervicalgia: Secondary | ICD-10-CM | POA: Insufficient documentation

## 2013-11-17 DIAGNOSIS — Z79899 Other long term (current) drug therapy: Secondary | ICD-10-CM | POA: Insufficient documentation

## 2013-11-17 DIAGNOSIS — F411 Generalized anxiety disorder: Secondary | ICD-10-CM | POA: Insufficient documentation

## 2013-11-17 DIAGNOSIS — M7581 Other shoulder lesions, right shoulder: Secondary | ICD-10-CM

## 2013-11-17 DIAGNOSIS — M67919 Unspecified disorder of synovium and tendon, unspecified shoulder: Secondary | ICD-10-CM | POA: Insufficient documentation

## 2013-11-17 DIAGNOSIS — M791 Myalgia, unspecified site: Secondary | ICD-10-CM

## 2013-11-17 DIAGNOSIS — M549 Dorsalgia, unspecified: Secondary | ICD-10-CM | POA: Insufficient documentation

## 2013-11-17 DIAGNOSIS — Y990 Civilian activity done for income or pay: Secondary | ICD-10-CM | POA: Insufficient documentation

## 2013-11-17 DIAGNOSIS — Z8669 Personal history of other diseases of the nervous system and sense organs: Secondary | ICD-10-CM | POA: Insufficient documentation

## 2013-11-17 DIAGNOSIS — X500XXA Overexertion from strenuous movement or load, initial encounter: Secondary | ICD-10-CM | POA: Insufficient documentation

## 2013-11-17 DIAGNOSIS — G8911 Acute pain due to trauma: Secondary | ICD-10-CM | POA: Insufficient documentation

## 2013-11-17 DIAGNOSIS — Z8719 Personal history of other diseases of the digestive system: Secondary | ICD-10-CM | POA: Insufficient documentation

## 2013-11-17 DIAGNOSIS — IMO0001 Reserved for inherently not codable concepts without codable children: Secondary | ICD-10-CM | POA: Insufficient documentation

## 2013-11-17 DIAGNOSIS — S46011S Strain of muscle(s) and tendon(s) of the rotator cuff of right shoulder, sequela: Secondary | ICD-10-CM

## 2013-11-17 DIAGNOSIS — Y9389 Activity, other specified: Secondary | ICD-10-CM | POA: Insufficient documentation

## 2013-11-17 DIAGNOSIS — Y9289 Other specified places as the place of occurrence of the external cause: Secondary | ICD-10-CM | POA: Insufficient documentation

## 2013-11-17 DIAGNOSIS — M719 Bursopathy, unspecified: Principal | ICD-10-CM | POA: Insufficient documentation

## 2013-11-17 MED ORDER — NAPROXEN 500 MG PO TABS
500.0000 mg | ORAL_TABLET | Freq: Two times a day (BID) | ORAL | Status: DC
Start: 2013-11-17 — End: 2013-12-10

## 2013-11-17 MED ORDER — METHOCARBAMOL 500 MG PO TABS: 500.0000 mg | ORAL_TABLET | Freq: Two times a day (BID) | ORAL | Status: DC

## 2013-11-17 NOTE — ED Provider Notes (Signed)
CSN: [id number]     Arrival date & time 11/17/13  0818 History   First MD Initiated Contact with Patient 11/17/13 0836  This chart was scribed for Tanna Furry, MD by Anastasia Pall, ED Scribe. This patient was seen in room APA10/APA10 and the patient's care was started at 8:53 AM.     Chief Complaint  Patient presents with  . Shoulder Pain  . Back Pain    The history is provided by the patient. No language interpreter was used.   HPI Comments: Tiffany Ho is a 33 y.o. female who presents to the Emergency Department complaining of intermittent, right shoulder pain, with clicking sounds upon movement, onset 1.5 months ago at the end of 09/2013, when she was jerked into a wall while walking a dog on a leash. 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 She denies ever noticing the clicking in her shoulder before. She reports trying not to use her right arm as much. She reports her right shoulder pain, is usually worse at night when she sleeps, as opposed to when she uses it.   She reports gradually worsening, burning, intermittent, left neck pain, that radiates down her left arm, onset 1 month ago. She reports there was a 3 week gap in time between her right shoulder pain and her left neck pain. She reports some intermittent, numbness over her left arm, usually in the mornings after she wakes up. She denies h/o mvc, neck injury, prior shoulder injury.   She denies having seen anyone for her symptoms prior to coming to ED. She denies having taken any medication PTA. She denies any other symptoms.  She reports having Medicaid for insurance. She reports an allergy to Vicodin.   Dr. Ronnald Collum - [facility] PCP - Redge Gainer, MD  Past Medical History  Diagnosis Date  . Gallstones   . Anxiety   . Carpal tunnel syndrome, bilateral   . Seasonal allergies   . Panic attacks   . Constipation    Past Surgical History  Procedure Laterality Date  . Cesarean section    .  Cholecystectomy  07/29/2012    Procedure: LAPAROSCOPIC CHOLECYSTECTOMY WITH INTRAOPERATIVE CHOLANGIOGRAM;  Surgeon: Adin Hector, MD;  Location: [facility];  Service: General;  Laterality: N/A;   No family history on file. History  Substance Use Topics  . Smoking status: Never Smoker   . Smokeless tobacco: Not on file  . Alcohol Use: No   OB History   Grav Para Term Preterm Abortions TAB SAB Ect Mult Living                 Review of Systems  Constitutional: Negative for fever, chills, diaphoresis, appetite change and fatigue.  HENT: Negative for mouth sores, sore throat and trouble swallowing.   Eyes: Negative for visual disturbance.  Respiratory: Negative for cough, chest tightness, shortness of breath and wheezing.   Cardiovascular: Negative for chest pain.  Gastrointestinal: Negative for nausea, vomiting, abdominal pain, diarrhea and abdominal distention.  Endocrine: Negative for polydipsia, polyphagia and polyuria.  Genitourinary: Negative for dysuria, frequency and hematuria.  Musculoskeletal: Positive for arthralgias (right shoulder), back pain and neck pain (left neck). Negative for gait problem and joint swelling.  Skin: Negative for color change, pallor and rash.  Neurological: Positive for numbness (left neck, arm). Negative for dizziness, syncope, light-headedness and headaches.  Hematological: Does not bruise/bleed easily.  Psychiatric/Behavioral: Negative for behavioral problems and confusion.    Allergies  Vicodin  Home Medications   Current Outpatient Rx  Name  Route  Sig  Dispense  Refill  . hydrOXYzine (ATARAX/VISTARIL) 50 MG tablet      Take one tablet every night for sleep, prn.   30 tablet   0   . levonorgestrel (MIRENA) 20 MCG/24HR IUD   Intrauterine   1 each by Intrauterine route once.         . methocarbamol (ROBAXIN) 500 MG tablet   Oral   Take 1 tablet (500 mg total) by mouth 2 (two) times daily.   20 tablet   0   . naproxen (NAPROSYN)  500 MG tablet   Oral   Take 1 tablet (500 mg total) by mouth 2 (two) times daily.   30 tablet   0   . Vilazodone HCl 10 & 20 & 40 MG KIT   Oral   Take 1 tablet by mouth daily.   1 kit   0    BP 131/107  Pulse 90  Temp(Src) 97.9 F (36.6 C) (Oral)  Resp 17  Ht 5' 3"  (1.6 m)  SpO2 98%  LMP 11/10/2013  Physical Exam  Constitutional: She is oriented to person, place, and time. She appears well-developed and well-nourished. No distress.  HENT:  Head: Normocephalic.  Eyes: Conjunctivae are normal. Pupils are equal, round, and reactive to light. No scleral icterus.  Neck: Normal range of motion. Neck supple. No thyromegaly present.  Cardiovascular: Normal rate and regular rhythm.  Exam reveals no gallop and no friction rub.   No murmur heard. Pulmonary/Chest: Effort normal and breath sounds normal. No respiratory distress. She has no wheezes. She has no rales.  Abdominal: Soft. Bowel sounds are normal. She exhibits no distension. There is no tenderness. There is no rebound.  Musculoskeletal: Normal range of motion.  No pain with rotation of right shoulder. No tenderness over bicep. Slight pain starting at 90 degrees.   Neurological: She is alert and oriented to person, place, and time. She has normal strength. No cranial nerve deficit or sensory deficit. She exhibits normal muscle tone. Coordination normal.  Skin: Skin is warm and dry. No rash noted.  Psychiatric: She has a normal mood and affect. Her behavior is normal.    ED Course  Procedures (including critical care time)  DIAGNOSTIC STUDIES: Oxygen Saturation is 98% on room air, normal by my interpretation.    COORDINATION OF CARE: 9:02 AM-Discussed treatment plan which includes suspicion of rotator cuff tendonitis, pain medication, and muscle relaxant with pt at bedside and pt agreed to plan. Will refer to orthopedist, PT.    Labs Review Labs Reviewed - No data to display Imaging Review No results found.  EKG  Interpretation   None       MDM   1. Rotator cuff (capsule) sprain and strain, right, sequela   2. Muscular pain   3. Rotator cuff tendonitis, right        I personally performed the services described in this documentation, which was scribed in my presence. The recorded information has been reviewed and is accurate.    Tanna Furry, MD 11/22/13 [phone]

## 2013-11-17 NOTE — Discharge Instructions (Signed)
Musculoskeletal Pain Musculoskeletal pain is muscle and boney aches and pains. These pains can occur in any part of the body. Your caregiver may treat you without knowing the cause of the pain. They may treat you if blood or urine tests, X-rays, and other tests were normal.  CAUSES There is often not a definite cause or reason for these pains. These pains may be caused by a type of germ (virus). The discomfort may also come from overuse. Overuse includes working out too hard when your body is not fit. Boney aches also come from weather changes. Bone is sensitive to atmospheric pressure changes. HOME CARE INSTRUCTIONS   Ask when your test results will be ready. Make sure you get your test results.  Only take over-the-counter or prescription medicines for pain, discomfort, or fever as directed by your caregiver. If you were given medications for your condition, do not drive, operate machinery or power tools, or sign legal documents for 24 hours. Do not drink alcohol. Do not take sleeping pills or other medications that may interfere with treatment.  Continue all activities unless the activities cause more pain. When the pain lessens, slowly resume normal activities. Gradually increase the intensity and duration of the activities or exercise.  During periods of severe pain, bed rest may be helpful. Lay or sit in any position that is comfortable.  Putting ice on the injured area.  Put ice in a bag.  Place a towel between your skin and the bag.  Leave the ice on for 15 to 20 minutes, 3 to 4 times a day.  Follow up with your caregiver for continued problems and no reason can be found for the pain. If the pain becomes worse or does not go away, it may be necessary to repeat tests or do additional testing. Your caregiver may need to look further for a possible cause. SEEK IMMEDIATE MEDICAL CARE IF:  You have pain that is getting worse and is not relieved by medications.  You develop chest pain  that is associated with shortness or breath, sweating, feeling sick to your stomach (nauseous), or throw up (vomit).  Your pain becomes localized to the abdomen.  You develop any new symptoms that seem different or that concern you. MAKE SURE YOU:   Understand these instructions.  Will watch your condition.  Will get help right away if you are not doing well or get worse. Document Released: 10/23/2005 Document Revised: 01/15/2012 Document Reviewed: 06/27/2013 [facility] Patient Information 2014 [place].  Rotator Cuff Tendinitis  Rotator cuff tendinitis is inflammation of the tough, cord-like bands that connect muscle to bone (tendons) in your rotator cuff. Your rotator cuff is the collection of all the muscles and tendons that connect your arm to your shoulder. Your rotator cuff holds the head of your upper arm bone (humerus) in the cup (fossa) of your shoulder blade (scapula). CAUSES Rotator cuff tendinitis is usually caused by overusing the joint involved.  SIGNS AND SYMPTOMS  Deep ache in the shoulder also felt on the outside upper arm over the shoulder muscle.  Point tenderness over the area that is injured.  Pain comes on gradually and becomes worse with lifting the arm to the side (abduction) or turning it inward (internal rotation).  May lead to a chronic tear: When a rotator cuff tendon becomes inflamed, it runs the risk of losing its blood supply, causing some tendon fibers to die. This increases the risk that the tendon can fray and partially or completely tear.  DIAGNOSIS Rotator cuff tendinitis is diagnosed by taking a medical history, performing a physical exam, and reviewing results of imaging exams. The medical history is useful to help determine the type of rotator cuff injury. The physical exam will include looking at the injured shoulder, feeling the injured area, and watching you do range-of-motion exercises. X-ray exams are typically done to rule out other causes  of shoulder pain, such as fractures. MRI is the imaging exam usually used for significant shoulder injuries. Sometimes a dye study called CT arthrogram is done, but it is not as widely used as MRI. In some institutions, special ultrasound tests may also be used to aid in the diagnosis. TREATMENT  Less Severe Cases  Use of a sling to rest the shoulder for a short period of time. Prolonged use of the sling can cause stiffness, weakness, and loss of motion of the shoulder joint.  Anti-inflammatory medicines, such as ibuprofen or naproxen sodium, may be prescribed. More Severe Cases  Physical therapy.  Use of steroid injections into the shoulder joint.  Surgery. HOME CARE INSTRUCTIONS   Use a sling or splint until the pain decreases. Prolonged use of the sling can cause stiffness, weakness, and loss of motion of the shoulder joint.  Apply ice to the injured area:  Put ice in a plastic bag.  Place a towel between your skin and the bag.  Leave the ice on for 20 minutes, 2 3 times a day.  Try to avoid use other than gentle range of motion while your shoulder is painful. Use the shoulder and exercise only as directed by your health care provider. Stop exercises or range of motion if pain or discomfort increases, unless directed otherwise by your health care provider.  Only take over-the-counter or prescription medicines for pain, discomfort, or fever as directed by your health care provider.  If you were given a shoulder sling and straps (immobilizer), do not remove it except as directed, or until you see a health care provider for a follow-up exam. If you need to remove it, move your arm as little as possible or as directed.  You may want to sleep on several pillows at night to lessen swelling and pain. SEEK IMMEDIATE MEDICAL CARE IF:   Your shoulder pain increases or new pain develops in your arm, hand, or fingers and is not relieved with medicines.  You have new, unexplained symptoms,  especially increased numbness in the hands or loss of strength.  You develop any worsening of the problems that brought you in for care.  Your arm, hand, or fingers are numb or tingling.  Your arm, hand, or fingers are swollen, painful, or turn white or blue. MAKE SURE YOU:  Understand these instructions.  Will watch your condition.  Will get help right away if you are not doing well or get worse. Document Released: 01/13/2004 Document Revised: 08/13/2013 Document Reviewed: 06/04/2013 [facility] Patient Information 2014 [place], [place].

## 2013-11-17 NOTE — ED Notes (Signed)
Pt reports a "pull" injury to her rt shoulder last November.  Pt reports pain has radiated to left arm, shoulder, and back.  Pt describes pain as "burning".

## 2013-12-01 ENCOUNTER — Ambulatory Visit (HOSPITAL_COMMUNITY): Payer: Self-pay | Admitting: Specialist

## 2013-12-01 ENCOUNTER — Ambulatory Visit (HOSPITAL_COMMUNITY)
Admission: RE | Admit: 2013-12-01 | Discharge: 2013-12-01 | Disposition: A | Discharge status: Home / Self Care | Payer: Medicaid Other | Source: Ambulatory Visit | Attending: Emergency Medicine | Admitting: Emergency Medicine

## 2013-12-01 DIAGNOSIS — IMO0001 Reserved for inherently not codable concepts without codable children: Secondary | ICD-10-CM | POA: Insufficient documentation

## 2013-12-01 DIAGNOSIS — M25519 Pain in unspecified shoulder: Secondary | ICD-10-CM | POA: Insufficient documentation

## 2013-12-01 DIAGNOSIS — M542 Cervicalgia: Secondary | ICD-10-CM | POA: Insufficient documentation

## 2013-12-01 NOTE — Evaluation (Signed)
Occupational Therapy Evaluation  Patient Details  Name: Tiffany Ho MRN: [id number] Date of Birth: May 09, 1981  Today's Date: 12/01/2013 Time: 0539-7673 OT Time Calculation (min): 45 min OT Eval 45' Visit#: 1 of 1  Re-eval: 12/01/13  Assessment Diagnosis: Right Rotator Cuff Tendonitis and Left Muscular Neck Pain Prior Therapy: n/a  Authorization: Medicaid - 1 eval authorized  Authorization Time Period:    Authorization Visit#:   of     Past Medical History:  Past Medical History  Diagnosis Date  . Gallstones   . Anxiety   . Carpal tunnel syndrome, bilateral   . Seasonal allergies   . Panic attacks   . Constipation    Past Surgical History:  Past Surgical History  Procedure Laterality Date  . Cesarean section    . Cholecystectomy  07/29/2012    Procedure: LAPAROSCOPIC CHOLECYSTECTOMY WITH INTRAOPERATIVE CHOLANGIOGRAM;  Surgeon: Adin Hector, MD;  Location: [facility];  Service: General;  Laterality: N/A;    Subjective S:  Ive been experiencing burning pain in my left neck and shoulder and some in the right.  Its really bad at night, I can't get comfortable to sleep. Pertinent History: Patient has been experiencing burning pain in her neck and both shoulders, left greater than right for several months.  She presented to the ED at [facility] on 11/17/13 and was diagnosed with right rotator cuff tendonitis and left neck pain.  She has been referred to occupational therapy for evaluation and treatment.  Patient Stated Goals: Get rid of pain Pain Assessment Currently in Pain?: Yes Pain Score: 5  Pain Location: Shoulder Pain Orientation: Left Pain Type: Acute pain Pain Onset: More than a month ago Pain Frequency: Several days a week Multiple Pain Sites: Yes  Precautions/Restrictions  Precautions Precautions: None Restrictions Weight Bearing Restrictions: No  Balance Screening Balance Screen Has the patient fallen in the past 6 months: No Has the patient had a decrease  in activity level because of a fear of falling? : No Is the patient reluctant to leave their home because of a fear of falling? 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    Assessment ADL/Vision/Perception ADL ADL Comments: pain with sleeping, driving, dog walking Dominant Hand: Right Vision - History Baseline Vision: No visual deficits  Cognition/Observation Cognition Overall Cognitive Status: Within Functional Limits for tasks assessed  Sensation/Coordination/Edema Sensation Light Touch: Appears Intact  Additional Assessments RUE AROM (degrees) RUE Overall AROM Comments: RUE AROM and strength is WFL LUE AROM (degrees) LUE Overall AROM Comments: LUE AROM and strength is WFL Cervical AROM Overall Cervical AROM Comments: neck AROM is painful into extension, rotation right, rotation left, side bend right and side bend left, AROM is WFL Palpation Palpation: max fascial restrictions and muscle spasms in bilateral trapezius, sternocleidmastoid, rhomboids, scapular region     Exercise/Treatments    Modalities Modalities: Moist Heat Manual Therapy Manual Therapy: Myofascial release Myofascial Release: Myofascial release to bilateral scapular regions, sternocleidomastoid, trapezius regions to decrease pain and fascial restrictions  Moist Heat Therapy Number Minutes Moist Heat: 10 Minutes Moist Heat Location: Shoulder (cervical region and bilateral shoulders)  Occupational Therapy Assessment and Plan OT Assessment and Plan Clinical Impression Statement: A:  Patient presents with increased pain and restrictions in bilateral shoulders and neck region causing difficulty and increased pain with driving, work, and sleep. Pt will  benefit from skilled therapeutic intervention in order to improve on the following  deficits: Pain Rehab Potential: Good OT Frequency: Min 1X/week OT Duration:  (1 week due to insurance limitations) OT Treatment/Interventions: Self-care/ADL training;Therapeutic exercise;Patient/family education;Manual therapy OT Plan: P:  DC with HEP for stretching and pain control, as insurance only authorizes one visit.   Goals Short Term Goals Time to Complete Short Term Goals: 2 weeks Short Term Goal 1: Patient will be educated on HEP for decreasing tightness and improving flexibiity in her bilateral shoulder and neck regions.  Short Term Goal 1 Progress: Met  Problem List Patient Active Problem List   Diagnosis Date Noted  . Pain in joint, shoulder region 12/01/2013  . Cervicalgia 12/01/2013  . Gallstones 07/16/2012    End of Session Activity Tolerance: Patient tolerated treatment well General Behavior During Therapy: WFL for tasks assessed/performed OT Plan of Care OT Home Exercise Plan: cervical stretches, shoulder stretches, self massage, stretching with therapy ball OT Patient Instructions: scanned Consulted and Agree with Plan of Care: Patient  GO    Vangie Bicker, OTR/L  12/01/2013, 10:51 AM  Physician Documentation Your signature is required to indicate approval of the treatment plan as stated above.  Please sign and either send electronically or make a copy of this report for your files and return this physician signed original.  Please mark one 1.__approve of plan  2. ___approve of plan with the following conditions.   ______________________________                                                          _____________________ Physician Signature                                                                                                             Date

## 2013-12-10 ENCOUNTER — Ambulatory Visit (INDEPENDENT_AMBULATORY_CARE_PROVIDER_SITE_OTHER): Payer: Medicaid Other | Admitting: Family Medicine

## 2013-12-10 ENCOUNTER — Encounter: Payer: Self-pay | Admitting: Family Medicine

## 2013-12-10 VITALS — BP 114/78 | HR 83 | Temp 96.7°F | Ht 63.0 in | Wt 178.2 lb

## 2013-12-10 DIAGNOSIS — S161XXA Strain of muscle, fascia and tendon at neck level, initial encounter: Secondary | ICD-10-CM

## 2013-12-10 DIAGNOSIS — M25519 Pain in unspecified shoulder: Secondary | ICD-10-CM

## 2013-12-10 DIAGNOSIS — S139XXA Sprain of joints and ligaments of unspecified parts of neck, initial encounter: Secondary | ICD-10-CM

## 2013-12-10 MED ORDER — KETOROLAC TROMETHAMINE 30 MG/ML IJ SOLN: 30.0000 mg | Freq: Once | INTRAMUSCULAR | Status: DC

## 2013-12-10 MED ORDER — CYCLOBENZAPRINE HCL 10 MG PO TABS: 10.0000 mg | ORAL_TABLET | Freq: Three times a day (TID) | ORAL | Status: DC | PRN

## 2013-12-10 MED ORDER — PREDNISONE 50 MG PO TABS: ORAL_TABLET | ORAL | Status: DC

## 2013-12-10 MED ORDER — METHYLPREDNISOLONE SODIUM SUCC 125 MG IJ SOLR
125.0000 mg | Freq: Once | INTRAMUSCULAR | Status: AC
Start: 2013-12-10 — End: 2013-12-10
  Administered 2013-12-10: 125 mg via INTRAMUSCULAR

## 2013-12-10 NOTE — Patient Instructions (Signed)
Methylprednisolone Solution for Injection What is this medicine? METHYLPREDNISOLONE (meth ill pred NISS oh lone) is a corticosteroid. It is commonly used to treat inflammation of the skin, joints, lungs, and other organs. Common conditions treated include asthma, allergies, and arthritis. It is also used for other conditions, such as blood disorders and diseases of the adrenal glands. This medicine may be used for other purposes; ask your health care provider or pharmacist if you have questions. COMMON BRAND NAME(S): A-Methapred, Solu-Medrol What should I tell my health care provider before I take this medicine? They need to know if you have any of these conditions: -cataracts or glaucoma -Cushing's syndrome -heart disease -high blood pressure -infection including tuberculosis -low calcium or potassium levels in the blood -recent surgery -seizures -stomach or intestinal disease, including colitis -threadworms -thyroid problems -an unusual or allergic reaction to methylprednisolone, corticosteroids, benzyl alcohol, other medicines, foods, dyes, or preservatives -pregnant or trying to get pregnant -breast-feeding How should I use this medicine? This medicine is for injection or infusion into a vein. It is also for injection into a muscle. It is given by a health care professional in a hospital or clinic setting. Talk to your pediatrician regarding the use of this medicine in children. While this drug may be prescribed for selected conditions, precautions do apply. Overdosage: If you think you have taken too much of this medicine contact a poison control center or emergency room at once. NOTE: This medicine is only for you. Do not share this medicine with others. What if I miss a dose? This does not apply. What may interact with this medicine? Do not take this medicine with any of the following medications: -mifepristone This medicine may also interact with the following  medications: -aspirin and aspirin-like medicines -cyclosporin -ketoconazole -phenobarbital -phenytoin -rifampin -tacrolimus -troleandomycin -vaccines -warfarin This list may not describe all possible interactions. Give your health care provider a list of all the medicines, herbs, non-prescription drugs, or dietary supplements you use. Also tell them if you smoke, drink alcohol, or use illegal drugs. Some items may interact with your medicine. What should I watch for while using this medicine? Visit your doctor or health care professional for regular checks on your progress. If you are taking this medicine for a long time, carry an identification card with your name and address, the type and dose of your medicine, and your doctor's name and address. The medicine may increase your risk of getting an infection. Stay away from people who are sick. Tell your doctor or health care professional if you are around anyone with measles or chickenpox. You may need to avoid some vaccines. Talk to your health care provider for more information. If you are going to have surgery, tell your doctor or health care professional that you have taken this medicine within the last twelve months. Ask your doctor or health care professional about your diet. You may need to lower the amount of salt you eat. The medicine can increase your blood sugar. If you are a diabetic check with your doctor if you need help adjusting the dose of your diabetic medicine. What side effects may I notice from receiving this medicine? Side effects that you should report to your doctor or health care professional as soon as possible: -allergic reactions like skin rash, itching or hives, swelling of the face, lips, or tongue -bloody or tarry stools -changes in vision -eye pain or bulging eyes -fever, sore throat, sneezing, cough, or other signs of infection, wounds that will   not heal -increased thirst -irregular heartbeat -muscle  cramps -pain in hips, back, ribs, arms, shoulders, or legs -swelling of the ankles, feet, hands -trouble passing urine or change in the amount of urine -unusual bleeding or bruising -unusually weak or tired -weight gain or weight loss Side effects that usually do not require medical attention (report to your doctor or health care professional if they continue or are bothersome): -changes in emotions or moods -constipation or diarrhea -headache -irritation at site where injected -nausea, vomiting -skin problems, acne, thin and shiny skin -trouble sleeping -unusual hair growth on the face or body This list may not describe all possible side effects. Call your doctor for medical advice about side effects. You may report side effects to FDA at 1-800-FDA-1088. Where should I keep my medicine? This drug is given in a hospital or clinic and will not be stored at home. NOTE: This sheet is a summary. It may not cover all possible information. If you have questions about this medicine, talk to your doctor, pharmacist, or health care provider.  2014, [employer]. (2012-07-23 11:37:16)  

## 2013-12-10 NOTE — Progress Notes (Signed)
   Subjective:    Patient ID: Tiffany Ho, female    DOB: 1981/05/26, 33 y.o.   MRN: [id number]  HPI  Neck and shoulder pain x 4-5 weeks.  Pt works at a [employer]. States that pain initially started when she "roughhoused" by large dog.  Had acute flare of severe pain 3 weeks ago.  Went to ER. Was diagnosed with rotator cuff strain.  States that she was placed on NSAIDs and muscle relaxer.  Sxs were minimally to mildly improved with treatment.  Has still had severe pain.  Worse at night.  MInimal to mild radicular sxs, predominantly L sided.  No frank numbness.  Mild arm pain with rotational movement of shoulder.   Patient Active Problem List   Diagnosis Date Noted  . Pain in joint, shoulder region 12/01/2013  . Cervicalgia 12/01/2013  . Gallstones 07/16/2012      Review of Systems  All other systems reviewed and are negative.       Objective:   Physical Exam  Constitutional: She appears well-developed and well-nourished.  HENT:  Head: Normocephalic and atraumatic.    + TTP and pain over affected area with movement.    Eyes: Conjunctivae are normal. Pupils are equal, round, and reactive to light.  Cardiovascular: Normal rate and regular rhythm.   Pulmonary/Chest: Effort normal and breath sounds normal.  Abdominal: Soft.  Musculoskeletal: Normal range of motion.  L shoulder full ROM  + mild pain with resisted external rotation.  Empty can mildly positive on L.    Neurological: She is alert.  Skin: Skin is warm.  Neurovascularly intact distally.          Assessment & Plan:  Cervical strain - Plan: ketorolac (TORADOL) 30 MG/ML injection 30 mg, predniSONE (DELTASONE) 50 MG tablet, cyclobenzaprine (FLEXERIL) 10 MG tablet  Pain in joint, shoulder region - Plan: ketorolac (TORADOL) 30 MG/ML injection 30 mg, predniSONE (DELTASONE) 50 MG tablet, cyclobenzaprine (FLEXERIL) 10 MG tablet  Unable to obtain xrays today-Machine down.  No focal neuro  deficits on exam.  Will place pt on course of prednisone and flexeril for treatment.  Toradol 30mg  IM x1 today.  Plan for sports medicine follow up.  Will obtain xrays at a later time.  Discussed general., MSK , and neuro red flags.  Follow up as needed.     The patient and/or caregiver has been counseled thoroughly with regard to treatment plan and/or medications prescribed including dosage, schedule, interactions, rationale for use, and possible side effects and they verbalize understanding. Diagnoses and expected course of recovery discussed and will return if not improved as expected or if the condition worsens. Patient and/or caregiver verbalized understanding.

## 2013-12-15 ENCOUNTER — Telehealth: Payer: Self-pay | Admitting: *Deleted

## 2013-12-15 ENCOUNTER — Other Ambulatory Visit (INDEPENDENT_AMBULATORY_CARE_PROVIDER_SITE_OTHER): Payer: Medicaid Other

## 2013-12-15 DIAGNOSIS — S139XXA Sprain of joints and ligaments of unspecified parts of neck, initial encounter: Secondary | ICD-10-CM

## 2013-12-15 DIAGNOSIS — M25519 Pain in unspecified shoulder: Secondary | ICD-10-CM

## 2013-12-15 DIAGNOSIS — S161XXA Strain of muscle, fascia and tendon at neck level, initial encounter: Secondary | ICD-10-CM

## 2013-12-15 NOTE — Telephone Encounter (Signed)
toradol was on back order when pt  Was here. She says that she needs something for the pain/pulling in left shoulder to use while she is working. The flexeril makes her sleepy Pharm is [facility] [place] xrays were done this am

## 2013-12-16 MED ORDER — MELOXICAM 15 MG PO TABS: 15.0000 mg | ORAL_TABLET | Freq: Every day | ORAL | Status: DC

## 2013-12-16 NOTE — Telephone Encounter (Signed)
Informed that xrays are normal. Pain hasn't resolved. I will send in Mobic and patient will let me know if symptoms do not improve so that we can make a referral.

## 2013-12-16 NOTE — Telephone Encounter (Signed)
I called in prednisone.  Did she not pick this up? You can call in mobic 15mg  PO QD #30 RF#1  if she completed prednisone and is still having pain.  Otherwise follow up with sports medicine if sxs persist.

## 2014-05-20 ENCOUNTER — Telehealth: Payer: Self-pay | Admitting: Family Medicine

## 2014-05-20 NOTE — Telephone Encounter (Signed)
appt scheduled

## 2014-05-21 ENCOUNTER — Encounter: Payer: Self-pay | Admitting: Nurse Practitioner

## 2014-05-21 ENCOUNTER — Ambulatory Visit (INDEPENDENT_AMBULATORY_CARE_PROVIDER_SITE_OTHER): Payer: Medicaid Other | Admitting: Nurse Practitioner

## 2014-05-21 VITALS — BP 118/78 | HR 87 | Temp 98.7°F | Ht 63.0 in | Wt 171.8 lb

## 2014-05-21 DIAGNOSIS — F32A Depression, unspecified: Secondary | ICD-10-CM

## 2014-05-21 DIAGNOSIS — R5381 Other malaise: Secondary | ICD-10-CM

## 2014-05-21 DIAGNOSIS — L659 Nonscarring hair loss, unspecified: Secondary | ICD-10-CM | POA: Insufficient documentation

## 2014-05-21 DIAGNOSIS — F329 Major depressive disorder, single episode, unspecified: Secondary | ICD-10-CM

## 2014-05-21 DIAGNOSIS — R5383 Other fatigue: Secondary | ICD-10-CM

## 2014-05-21 DIAGNOSIS — F3289 Other specified depressive episodes: Secondary | ICD-10-CM

## 2014-05-21 MED ORDER — ESCITALOPRAM OXALATE 10 MG PO TABS: 10.0000 mg | ORAL_TABLET | Freq: Every day | ORAL | Status: DC

## 2014-05-21 NOTE — Progress Notes (Signed)
   Subjective:    Patient ID: Tiffany Ho, female    DOB: 1981/06/10, 33 y.o.   MRN: [id number]  HPI  patient is here today complaining of hair loss and tinning. She reports that is going through a divorce and she has been more stress than usual. Reports brittle nail, decrease appetite and weight gain. Low energy level. She reports having [facility] merina IUD [facility] august 2014.      Review of Systems  Constitutional: Negative.   HENT: Negative.   Eyes: Negative.   Respiratory: Negative.   Cardiovascular: Negative.   Endocrine: Negative.   Genitourinary: Negative.   Allergic/Immunologic: Negative.   Neurological: Negative.   Hematological: Negative.        Objective:   Physical Exam  Constitutional: She is oriented to person, place, and time. She appears well-developed.  HENT:  Head: Normocephalic.  Hair loss/tinning.   Neck: Normal range of motion.  Cardiovascular: Normal rate and regular rhythm.   Pulmonary/Chest: Effort normal and breath sounds normal.  Abdominal: Soft.  Musculoskeletal: Normal range of motion.  Neurological: She is alert and oriented to person, place, and time.    BP 118/78  Pulse 87  Temp(Src) 98.7 F (37.1 C) (Oral)  Ht $R'5\' 3"'wP$  (1.6 m)  Wt 171 lb 12.8 oz (77.928 kg)  BMI 30.44 kg/m2       Assessment & Plan:  1. Hair loss 2. Other malaise and fatigue - Thyroid Panel With TSH - BMP8+EGFR - Vitamin B12 - BMP8+EGFR - Anemia Profile B  3. Depression Meds ordered this encounter  Medications  . escitalopram (LEXAPRO) 10 MG tablet    Sig: Take 1 tablet (10 mg total) by mouth daily.    Dispense:  30 tablet    Refill:  3    Order Specific Question:  Supervising Provider    Answer:  Chipper Herb [[id number]]   Stress management Reviewed medication side effects  Mary-Margaret Hassell Done, FNP

## 2014-05-21 NOTE — Patient Instructions (Addendum)
Alopecia Areata Alopecia areata is a self-destructing (autoimmune) disease that results in the loss of hair. In this condition your body's immune system attacks the hair follicle. The hair follicle is responsible for growing hair. Hair loss can occur on the scalp and other parts of the body. It usually starts as one or more small, round, smooth patches of hair loss. It occurs in males and females of all ages and races, but usually starts before age 34. The scalp is the most commonly affected area, but the beard or any hair-bearing site can be affected. This type of hair loss does not leave scars where the hair was lost.  Many people with alopecia areata only have a few patches of hair loss. In others, extensive patchy hair loss occurs. In a few people, all scalp hair is lost (alopecia totalis), or hair is lost from the entire scalp and body (alopecia universalis). No matter how widespread the hair loss, the hair follicles remain alive and are ready to resume normal hair production whenever they receive the correct signal. Hair re-growth may occur without treatment and can even restart after years of hair loss.  CAUSES  It is thought that something triggers the immune system to stop hair growth. It is not always known what the cause is. Some people have genetic markers that can increase the chance of developing alopecia areata. Alopecia areata often occurs in families whose members have had:  Asthma.  Hay fever.  Atopic eczema.  Some autoimmune diseases may also be a trigger, such as:  Thyroid disease.  Diabetes.  Rheumatoid arthritis.  Lupus erythematosus.  Vitiligo.  Pernicious anemia.  Addison's disease. OTHER SYMPTOMS In some people, the nail beds may develop rows of tiny dents (stippling) or the nail beds can become distorted. Other than the hair and nail beds, no other body part is affected.  PROGNOSIS  Alopecia areata is not medically disabling. People with alopecia areata are  usually in excellent health. Hair loss can be emotionally difficult. The National Alopecia Areata Foundation has resources available to help individuals and families with alopecia areata. Their goal is to help people with the condition live full, productive lives. There are many successful, well-adjusted, contented people living with Alopecia areata. Alopecia areata can be overcome with:  A positive self image.  Sound medical facts.  The support of others, such as:  Sometimes professional counseling is helpful to develop one's self-confidence and positive self-image. TREATMENT  There is no cure for alopecia areata. There are several available treatments. Treatments are most effective in milder cases. No treatment is effective for everyone. Choice of treatment depends mainly on a person's age and the extent of their hair loss. Alopecia areata occurs in two forms:   A mild patchy form where less than 50 percent of scalp hair is lost.  An extensive form where greater than 50 percent of scalp hair is lost. These two forms of alopecia areata behave quite differently, and the choice of treatment depends on which form is present. Current treatments do not turn alopecia areata off. They can stimulate the hair follicle to produce hair.  Some medications used to treat mild cases include:  Cortisone injections. The most common treatment is the injection of cortisone into the bare skin patches. The injections are usually given by a caregiver specializing in skin issues (dermatologist). This caregiver will use a tiny needle to give multiple injections into the skin in and around the bare patches. The injections are repeated once a month.   If new hair growth occurs, it is usually visible within 4 weeks. Treatment does not prevent new patches of hair loss from developing. There are few side effects from local cortisone injections. Occasionally, temporary dents (depressions) in the skin result from the local  injections, but these dents can fill in by themselves.  Topical minoxidil. Five percent topical minoxidil solution applied twice daily may grow hair in alopecia areata. Scalp, eyebrows, and beard hair may respond. If scalp hair re-grows completely, treatment can be stopped. Response may improve if topical cortisone cream is applied 30 minutes after the minoxidil. Topical minoxidil is safe, easy to use, and does not lower blood pressure in persons with normal blood pressure. Minoxidil can lead to unwanted facial hair growth in some people.  Anthralin cream or ointment. Another treatment is the application of anthralin cream or ointment. Anthralin is a tar-like substance that has been used widely for psoriasis. Anthralin is applied to the bare patches once daily. It is washed off after a short time, usually 30 to 60 minutes later. If new hair growth occurs, it is seen in 8 to 12 weeks. Anthralin can be irritating to the skin. It can cause temporary, brownish discoloration of the treated skin. By using short treatment times, skin irritation and skin staining are reduced without decreasing effectiveness. Care must be taken not to get anthralin in the eyes. Some of the medications used for more extensive cases where there is greater than 50% hair loss include:  Cortisone pills. Cortisone pills are sometimes given for extensive scalp hair loss. Cortisone taken internally is much stronger than local injections of cortisone into the skin. It is necessary to discuss possible side effects of cortisone pills with your caregiver. In general, however, cortisone pills are used in relatively few patients with alopecia areata due to health risks from prolonged use. Also, hair that has grown is likely to fall out when the cortisone pills are stopped.  Topical minoxidil. See previous explanation under mild, patchy alopecia areata. However, minoxidil is not effective in total loss of scalp hair (alopecia totalis).  Topical  immunotherapy. Another method of treating alopecia areata or alopecia totalis/universalis involves producing an allergic rash or allergic contact dermatitis. Chemicals such as diphencyprone (DPCP) or squaric acid dibutyl ester (SADBE) are applied to the scalp to produce an allergic rash which resembles poison oak or ivy. Approximately 40% of patients treated with topical immunotherapy will re-grow scalp hair after about 6 months of treatment. Those who do successfully re-grow scalp hair will need to continue treatment to maintain hair re-growth.  Wigs. For extensive hair loss, a wig can be an important option for some people. Proper attention will make a quality wig look completely natural. A wig will need to be cut, thinned, and styled. To keep a net base wig from falling off, special double-sided tape can be purchased in beauty supply outlets and fastened to the inside of the wig.  For those with completely bare heads, there are suction caps to which any wig can be attached. There are also entire suction cap wig units. FOR MORE INFORMATION National Alopecia Areata Foundation: https://www.berry.org/ Document Released: 05/27/2004 Document Revised: 01/15/2012 Document Reviewed: 12/29/2008 [facility] Patient Information 2015 [place], [place]. This information is not intended to replace advice given to you by your health care provider. Make sure you discuss any questions you have with your health care provider.    Stress Stress-related medical problems are becoming increasingly common. The body has a built-in physical response to stressful  situations. Faced with pressure, challenge or danger, we need to react quickly. Our bodies release hormones such as cortisol and adrenaline to help do this. These hormones are part of the "fight or flight" response and affect the metabolic rate, heart rate and blood pressure, resulting in a heightened, stressed state that prepares the body for optimum performance in dealing with a  stressful situation. It is likely that early man required these mechanisms to stay alive, but usually modern stresses do not call for this, and the same hormones released in today's world can damage health and reduce coping ability. CAUSES  Pressure to perform at work, at school or in sports.  Threats of physical violence.  Money worries.  Arguments.  Family conflicts.  Divorce or separation from significant other.  Bereavement.  New job or unemployment.  Changes in location.  Alcohol or drug abuse. SOMETIMES, THERE IS NO PARTICULAR REASON FOR DEVELOPING STRESS. Almost all people are at risk of being stressed at some time in their lives. It is important to know that some stress is temporary and some is long term.  Temporary stress will go away when a situation is resolved. Most people can cope with short periods of stress, and it can often be relieved by relaxing, taking a walk or getting any type of exercise, chatting through issues with friends, or having a good night's sleep.  Chronic (long-term, continuous) stress is much harder to deal with. It can be psychologically and emotionally damaging. It can be harmful both for an individual and for friends and family. SYMPTOMS Everyone reacts to stress differently. There are some common effects that help [place] recognize it. In times of extreme stress, people may:  Shake uncontrollably.  Breathe faster and deeper than normal (hyperventilate).  Vomit.  For people with asthma, stress can trigger an attack.  For some people, stress may trigger migraine headaches, ulcers, and body pain. PHYSICAL EFFECTS OF STRESS MAY INCLUDE:  Loss of energy.  Skin problems.  Aches and pains resulting from tense muscles, including neck ache, backache and tension headaches.  Increased pain from arthritis and other conditions.  Irregular heart beat (palpitations).  Periods of irritability or anger.  Apathy or depression.  Anxiety (feeling  uptight or worrying).  Unusual behavior.  Loss of appetite.  Comfort eating.  Lack of concentration.  Loss of, or decreased, sex-drive.  Increased smoking, drinking, or recreational drug use.  For women, missed periods.  Ulcers, joint pain, and muscle pain. Post-traumatic stress is the stress caused by any serious accident, strong emotional damage, or extremely difficult or violent experience such as rape or war. Post-traumatic stress victims can experience mixtures of emotions such as fear, shame, depression, guilt or anger. It may include recurrent memories or images that may be haunting. These feelings can last for weeks, months or even years after the traumatic event that triggered them. Specialized treatment, possibly with medicines and psychological therapies, is available. If stress is causing physical symptoms, severe distress or making it difficult for you to function as normal, it is worth seeing your caregiver. It is important to remember that although stress is a usual part of life, extreme or prolonged stress can lead to other illnesses that will need treatment. It is better to visit a doctor sooner rather than later. Stress has been linked to the development of high blood pressure and heart disease, as well as insomnia and depression. There is no diagnostic test for stress since everyone reacts to it differently. But a caregiver  will be able to spot the physical symptoms, such as:  Headaches.  Shingles.  Ulcers. Emotional distress such as intense worry, low mood or irritability should be detected when the doctor asks pertinent questions to identify any underlying problems that might be the cause. In case there are physical reasons for the symptoms, the doctor may also want to do some tests to exclude certain conditions. If you feel that you are suffering from stress, try to identify the aspects of your life that are causing it. Sometimes you may not be able to change or  avoid them, but even a small change can have a positive ripple effect. A simple lifestyle change can make all the difference. STRATEGIES THAT CAN HELP DEAL WITH STRESS:  Delegating or sharing responsibilities.  Avoiding confrontations.  Learning to be more assertive.  Regular exercise.  Avoid using alcohol or street drugs to cope.  Eating a healthy, balanced diet, rich in fruit and vegetables and proteins.  Finding humor or absurdity in stressful situations.  Never taking on more than you know you can handle comfortably.  Organizing your time better to get as much done as possible.  Talking to friends or family and sharing your thoughts and fears.  Listening to music or relaxation tapes.  Relaxation techniques like deep breathing, meditation, and yoga.  Tensing and then relaxing your muscles, starting at the toes and working up to the head and neck. If you think that you would benefit from help, either in identifying the things that are causing your stress or in learning techniques to help you relax, see a caregiver who is capable of helping you with this. Rather than relying on medications, it is usually better to try and identify the things in your life that are causing stress and try to deal with them. There are many techniques of managing stress including counseling, psychotherapy, aromatherapy, yoga, and exercise. Your caregiver can help you determine what is best for you. Document Released: 01/13/2003 Document Revised: 10/28/2013 Document Reviewed: 12/10/2007 [facility] Patient Information 2015 [place], [place]. This information is not intended to replace advice given to you by your health care provider. Make sure you discuss any questions you have with your health care provider.

## 2014-05-22 LAB — ANEMIA PROFILE B
BASOS: 0 %
Basophils Absolute: 0 10*3/uL (ref 0.0–0.2)
Eos: 3 %
Eosinophils Absolute: 0.3 10*3/uL (ref 0.0–0.4)
FOLATE: 12.9 ng/mL (ref >3.0)
Ferritin: 69 ng/mL (ref 15–150)
HEMATOCRIT: 41.9 % (ref 34.0–46.6)
HEMOGLOBIN: 14.5 g/dL (ref 11.1–15.9)
IRON: 83 ug/dL (ref 35–155)
Immature Grans (Abs): 0 10*3/uL (ref 0.0–0.1)
Immature Granulocytes: 0 %
Iron Saturation: 27 % (ref 15–55)
LYMPHS: 28 %
Lymphocytes Absolute: 3 10*3/uL (ref 0.7–3.1)
MCH: 30.3 pg (ref 26.6–33.0)
MCHC: 34.6 g/dL (ref 31.5–35.7)
MCV: 88 fL (ref 79–97)
MONOCYTES: 7 %
Monocytes Absolute: 0.7 10*3/uL (ref 0.1–0.9)
NEUTROS ABS: 6.6 10*3/uL (ref 1.4–7.0)
Neutrophils Relative %: 62 %
Platelets: 435 10*3/uL — ABNORMAL HIGH (ref 150–379)
RBC: 4.78 x10E6/uL (ref 3.77–5.28)
RDW: 13.1 % (ref 12.3–15.4)
RETIC CT PCT: 1 % (ref 0.6–2.6)
TIBC: 313 ug/dL (ref 250–450)
UIBC: 230 ug/dL (ref 150–375)
Vitamin B-12: 654 pg/mL (ref 211–946)
WBC: 10.6 10*3/uL (ref 3.4–10.8)

## 2014-05-22 LAB — BMP8+EGFR
BUN/Creatinine Ratio: 11 (ref 8–20)
BUN: 10 mg/dL (ref 6–20)
CALCIUM: 9.4 mg/dL (ref 8.7–10.2)
CO2: 22 mmol/L (ref 18–29)
CREATININE: 0.94 mg/dL (ref 0.57–1.00)
Chloride: 101 mmol/L (ref 97–108)
GFR calc Af Amer: 92 mL/min/{1.73_m2} (ref >59)
GFR calc non Af Amer: 79 mL/min/{1.73_m2} (ref >59)
GLUCOSE: 77 mg/dL (ref 65–99)
POTASSIUM: 4.4 mmol/L (ref 3.5–5.2)
SODIUM: 139 mmol/L (ref 134–144)

## 2014-05-22 LAB — THYROID PANEL WITH TSH
FREE THYROXINE INDEX: 1.7 (ref 1.2–4.9)
T3 UPTAKE RATIO: 27 % (ref 24–39)
T4, Total: 6.4 ug/dL (ref 4.5–12.0)
TSH: 2.05 u[IU]/mL (ref 0.450–4.500)

## 2014-05-26 ENCOUNTER — Encounter: Payer: Self-pay | Admitting: Nurse Practitioner

## 2014-11-06 HISTORY — PX: WISDOM TOOTH EXTRACTION: SHX21

## 2015-01-04 ENCOUNTER — Encounter (HOSPITAL_COMMUNITY): Payer: Self-pay | Admitting: *Deleted

## 2015-01-04 ENCOUNTER — Emergency Department (HOSPITAL_COMMUNITY): Payer: BLUE CROSS/BLUE SHIELD

## 2015-01-04 ENCOUNTER — Emergency Department (HOSPITAL_COMMUNITY)
Admission: EM | Admit: 2015-01-04 | Discharge: 2015-01-04 | Disposition: A | Discharge status: Home / Self Care | Payer: BLUE CROSS/BLUE SHIELD | Attending: Emergency Medicine | Admitting: Emergency Medicine

## 2015-01-04 DIAGNOSIS — M542 Cervicalgia: Secondary | ICD-10-CM | POA: Diagnosis not present

## 2015-01-04 DIAGNOSIS — M25512 Pain in left shoulder: Secondary | ICD-10-CM | POA: Insufficient documentation

## 2015-01-04 DIAGNOSIS — Z79899 Other long term (current) drug therapy: Secondary | ICD-10-CM | POA: Insufficient documentation

## 2015-01-04 DIAGNOSIS — M25519 Pain in unspecified shoulder: Secondary | ICD-10-CM

## 2015-01-04 DIAGNOSIS — Z8669 Personal history of other diseases of the nervous system and sense organs: Secondary | ICD-10-CM | POA: Insufficient documentation

## 2015-01-04 DIAGNOSIS — F410 Panic disorder [episodic paroxysmal anxiety] without agoraphobia: Secondary | ICD-10-CM | POA: Insufficient documentation

## 2015-01-04 DIAGNOSIS — Z8719 Personal history of other diseases of the digestive system: Secondary | ICD-10-CM | POA: Insufficient documentation

## 2015-01-04 MED ORDER — METHOCARBAMOL 500 MG PO TABS
500.0000 mg | ORAL_TABLET | Freq: Two times a day (BID) | ORAL | Status: DC
Start: 2015-01-04 — End: 2021-06-13

## 2015-01-04 MED ORDER — TRAMADOL HCL 50 MG PO TABS: 50.0000 mg | ORAL_TABLET | Freq: Four times a day (QID) | ORAL | Status: DC | PRN

## 2015-01-04 NOTE — Discharge Instructions (Signed)
It is important that you follow up with the orthopedic doctor. Call for appointment. Do not take the narcotic pain medication or muscle relaxant if driving because they will make you sleepy.  Ct Cervical Spine Wo Contrast  01/04/2015   CLINICAL DATA:  Chronic left shoulder pain for 2 months, radiating down the left arm. Intermittent left hand numbness and discoloration. Neck tightness. Initial encounter.  EXAM: CT CERVICAL SPINE WITHOUT CONTRAST  TECHNIQUE: Multidetector CT imaging of the cervical spine was performed without intravenous contrast. Multiplanar CT image reconstructions were also generated.  COMPARISON:  Cervical spine radiographs performed 12/15/2013  FINDINGS: There is no evidence of fracture or subluxation. Mild reversal of the normal lordotic curvature of the cervical spine appears to be chronic in nature. Vertebral bodies demonstrate normal height and alignment. Intervertebral disc spaces are preserved. Prevertebral soft tissues are within normal limits.  There appears to be a large left paracentral disc protrusion at C5-C6, measuring approximately 7 mm in AP dimension and 1.7 cm in transverse dimension. 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  There is a 6 mm lucency within the right side of vertebral body C7.  The thyroid gland is unremarkable in appearance. The visualized lung apices are clear. No significant soft tissue abnormalities are seen. The visualized portions of the brain are unremarkable in appearance.  IMPRESSION: 1. Apparent large left paracentral disc protrusion at C5-C6, measuring approximately 7 mm in AP dimension and 1.7 cm in transverse dimension. 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 MRI of the cervical spine is recommended for further evaluation. 2. No evidence of fracture or  subluxation along the cervical spine. 3. 6 mm lucency noted within the right side of vertebral body C7. Would correlate with labs to assess for the possibility of multiple myeloma, though multiple myeloma is very unlikely given the patient's age; alternatively, bone scan could be considered for further evaluation.   Electronically Signed   By: Garald Balding M.D.   On: 01/04/2015 20:40   Dg Shoulder Left  01/04/2015   CLINICAL DATA:  Left shoulder pain for 2 months  EXAM: LEFT SHOULDER - 2+ VIEW  COMPARISON:  Radiograph 12/15/2013  FINDINGS: Glenohumeral joint is intact. No evidence of scapular fracture or humeral fracture. The acromioclavicular joint is intact.  IMPRESSION: No fracture or dislocation.  No significant arthropathy.   Electronically Signed   By: Suzy Bouchard M.D.   On: 01/04/2015 20:32     Shoulder Pain The shoulder is the joint that connects your arm to your body. Muscles and band-like tissues that connect bones to muscles (tendons) hold the joint together. Shoulder pain is felt if an injury or medical problem affects one or more parts of the shoulder. HOME CARE   Put ice on the sore area.  Put ice in a plastic bag.  Place a towel between your skin and the bag.  Leave the ice on for 15-20 minutes, 03-04 times a day for the first 2 days.  Stop using cold packs if they do not help with the pain.  If you were given something to keep your shoulder from moving (sling; shoulder immobilizer), wear it as told. Only take it off to shower or bathe.  Move your arm as little as possible, but keep your hand moving to prevent puffiness (swelling).  Squeeze a soft ball or foam pad as much as possible  to help prevent swelling.  Take medicine as told by your doctor. GET HELP IF:  You have progressing new pain in your arm, hand, or fingers.  Your hand or fingers get cold.  Your medicine does not help lessen your pain. GET HELP RIGHT AWAY IF:   Your arm, hand, or fingers are numb  or tingling.  Your arm, hand, or fingers are puffy (swollen), painful, or turn white or blue. MAKE SURE YOU:   Understand these instructions.  Will watch your condition.  Will get help right away if you are not doing well or get worse. Document Released: 04/10/2008 Document Revised: 03/09/2014 Document Reviewed: 05/06/2012 [facility] Patient Information 2015 [place], [place]. This information is not intended to replace advice given to you by your health care provider. Make sure you discuss any questions you have with your health care provider.

## 2015-01-04 NOTE — ED Provider Notes (Signed)
CSN: [id number]     Arrival date & time 01/04/15  3507 History  This chart was scribed for non-physician practitioner Debroah Baller, NP, working with Fredia Sorrow, MD by Zola Button, ED Scribe. This patient was seen in room APFT20/APFT20 and the patient's care was started at 7:01 PM.     Chief Complaint  Patient presents with  . Shoulder Pain   Patient is a 34 y.o. female presenting with shoulder pain. The history is provided by the patient. No language interpreter was used.  Shoulder Pain Location:  Shoulder Time since incident:  2 months Shoulder location:  L shoulder Pain details:    Radiates to:  L arm   Onset quality:  Gradual   Duration:  2 months Prior injury to area:  Yes Relieved by:  Nothing Ineffective treatments:  Muscle relaxant Associated symptoms: neck pain and numbness    HPI Comments: Tiffany Ho is a 34 y.o. female who presents to the Emergency Department complaining of gradual onset left shoulder pain radiating down her arm that started 2 months ago. She states that she had a left rotator cuff injury 2 years ago due to an injury. Patient also reports occasional hand numbness and tingling and her neck feeling tight. She feels as if her shoulder needs to "pop." The pain is worsened with breathing and laughing. Patient has been to her PCP for this; she was told she has tendonitis and was given prednisone, muscle relaxer and pain medications. She states that the medications have not been helping. She also notes lymph nodes popping up in her collarbone but after treatment with antibiotics they went away.  She had been treated for the tendonitis with steroids. Patient works at an [employer].  PCP: [facility]  Past Medical History  Diagnosis Date  . Gallstones   . Anxiety   . Carpal tunnel syndrome, bilateral   . Seasonal allergies   . Panic attacks   . Constipation    Past Surgical History  Procedure Laterality Date  . Cesarean  section    . Cholecystectomy  07/29/2012    Procedure: LAPAROSCOPIC CHOLECYSTECTOMY WITH INTRAOPERATIVE CHOLANGIOGRAM;  Surgeon: Adin Hector, MD;  Location: [facility];  Service: General;  Laterality: N/A;   History reviewed. No pertinent family history. History  Substance Use Topics  . Smoking status: Never Smoker   . Smokeless tobacco: Not on file  . Alcohol Use: No   OB History    No data available     Review of Systems  Musculoskeletal: Positive for arthralgias and neck pain.       Left shoulder pain  Neurological: Positive for numbness.  all other systems negative    Allergies  Vicodin  Home Medications   Prior to Admission medications   Medication Sig Start Date End Date Taking? Authorizing Provider  ibuprofen (ADVIL,MOTRIN) 800 MG tablet Take 800-1,600 mg by mouth every 6 (six) hours as needed for mild pain or moderate pain.  11/18/14  Yes Historical Provider, MD  levonorgestrel (MIRENA) 20 MCG/24HR IUD 1 each by Intrauterine route once.   Yes Historical Provider, MD  methocarbamol (ROBAXIN) 500 MG tablet Take 1 tablet (500 mg total) by mouth 2 (two) times daily. 01/04/15   Deran Barro Bunnie Pion, NP  traMADol (ULTRAM) 50 MG tablet Take 1 tablet (50 mg total) by mouth every 6 (six) hours as needed. 01/04/15   Everton Bertha Bunnie Pion, NP   BP 134/88 mmHg  Pulse 94  Temp(Src) 97.8 F (36.6  C) (Oral)  Resp 20  Ht 5' 4"  (1.626 m)  Wt 156 lb (70.761 kg)  BMI 26.76 kg/m2  SpO2 100%  LMP  (LMP Unknown) Physical Exam  Constitutional: She is oriented to person, place, and time. She appears well-developed and well-nourished.  HENT:  Head: Normocephalic and atraumatic.  Eyes: Conjunctivae and EOM are normal.  Neck: Trachea normal. Neck supple. Spinous process tenderness and muscular tenderness present. No rigidity. Normal range of motion present.  Pain radiates from the neck to the left shoulder. Pain increases with range of motion.   Cardiovascular: Normal rate and regular rhythm.    Pulmonary/Chest: Effort normal. She has no wheezes. She has no rales.  Abdominal: There is no tenderness.  Musculoskeletal: Normal range of motion.       Right shoulder: She exhibits tenderness, pain and spasm. She exhibits normal range of motion (due to pain), no swelling, no crepitus, no deformity, no laceration, normal pulse and normal strength.  Radial pulses 2+ and equal, adequate circulation, good touch sensation.   Neurological: She is alert and oriented to person, place, and time. She has normal strength. No cranial nerve deficit or sensory deficit. Gait normal.  Reflex Scores:      Tricep reflexes are 2+ on the right side and 2+ on the left side.      Bicep reflexes are 2+ on the right side and 2+ on the left side. Skin: Skin is warm and dry.  Psychiatric: She has a normal mood and affect. Her behavior is normal.  Nursing note and vitals reviewed.   ED Course  Procedures  DIAGNOSTIC STUDIES: Oxygen Saturation is 100% on room air, normal by my interpretation.    COORDINATION OF CARE: 7:04 PM-Discussed treatment plan which includes XR imaging with pt at bedside and pt agreed to plan.  Ct Cervical Spine Wo Contrast  01/04/2015   CLINICAL DATA:  Chronic left shoulder pain for 2 months, radiating down the left arm. Intermittent left hand numbness and discoloration. Neck tightness. Initial encounter.  EXAM: CT CERVICAL SPINE WITHOUT CONTRAST  TECHNIQUE: Multidetector CT imaging of the cervical spine was performed without intravenous contrast. Multiplanar CT image reconstructions were also generated.  COMPARISON:  Cervical spine radiographs performed 12/15/2013  FINDINGS: There is no evidence of fracture or subluxation. Mild reversal of the normal lordotic curvature of the cervical spine appears to be chronic in nature. Vertebral bodies demonstrate normal height and alignment. Intervertebral disc spaces are preserved. Prevertebral soft tissues are within normal limits.  There appears to be  a large left paracentral disc protrusion at C5-C6, measuring approximately 7 mm in AP dimension and 1.7 cm in transverse dimension. 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  There is a 6 mm lucency within the right side of vertebral body C7.  The thyroid gland is unremarkable in appearance. The visualized lung apices are clear. No significant soft tissue abnormalities are seen. The visualized portions of the brain are unremarkable in appearance.  IMPRESSION: 1. Apparent large left paracentral disc protrusion at C5-C6, measuring approximately 7 mm in AP dimension and 1.7 cm in transverse dimension. 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 MRI of the cervical spine is recommended for further evaluation. 2. No evidence of fracture or subluxation along the cervical spine. 3. 6 mm lucency noted within the right side of vertebral body C7. Would correlate with labs to  assess for the possibility of multiple myeloma, though multiple myeloma is very unlikely given the patient's age; alternatively, bone scan could be considered for further evaluation.   Electronically Signed   By: Garald Balding M.D.   On: 01/04/2015 20:40   Dg Shoulder Left  01/04/2015   CLINICAL DATA:  Left shoulder pain for 2 months  EXAM: LEFT SHOULDER - 2+ VIEW  COMPARISON:  Radiograph 12/15/2013  FINDINGS: Glenohumeral joint is intact. No evidence of scapular fracture or humeral fracture. The acromioclavicular joint is intact.  IMPRESSION: No fracture or dislocation.  No significant arthropathy.   Electronically Signed   By: Suzy Bouchard M.D.   On: 01/04/2015 20:32     MDM  34 y.o. female with hx of left shoulder pain x 2 months that has been treated as bursitis with NSAIDS and muscle relaxants. Stable for d/c without neurovascular compromise. Discussed in  detail with the patient CT cervical spine results and need for follow up. She will call the orthopedic doctor tomorrow for an appointment. Will treat for pain.  Final diagnoses:  Neck and shoulder pain   I personally performed the services described in this documentation, which was scribed in my presence. The recorded information has been reviewed and is accurate.    [place], [place] 01/05/15 [place], MD 01/07/15 [id number]

## 2015-01-04 NOTE — ED Notes (Signed)
Pain lt shoulder for 2 mos.

## 2015-01-19 ENCOUNTER — Other Ambulatory Visit (HOSPITAL_COMMUNITY): Payer: Self-pay | Admitting: Physical Medicine and Rehabilitation

## 2015-01-19 DIAGNOSIS — M5020 Other cervical disc displacement, unspecified cervical region: Secondary | ICD-10-CM

## 2015-01-21 ENCOUNTER — Ambulatory Visit (HOSPITAL_COMMUNITY)
Admission: RE | Admit: 2015-01-21 | Discharge: 2015-01-21 | Disposition: A | Discharge status: Home / Self Care | Payer: BLUE CROSS/BLUE SHIELD | Source: Ambulatory Visit | Attending: Physical Medicine and Rehabilitation | Admitting: Physical Medicine and Rehabilitation

## 2015-01-21 DIAGNOSIS — M2578 Osteophyte, vertebrae: Secondary | ICD-10-CM | POA: Insufficient documentation

## 2015-01-21 DIAGNOSIS — M5022 Other cervical disc displacement, mid-cervical region: Secondary | ICD-10-CM | POA: Insufficient documentation

## 2015-01-21 DIAGNOSIS — D1809 Hemangioma of other sites: Secondary | ICD-10-CM | POA: Insufficient documentation

## 2015-01-21 DIAGNOSIS — M542 Cervicalgia: Secondary | ICD-10-CM | POA: Diagnosis present

## 2015-01-21 DIAGNOSIS — M5020 Other cervical disc displacement, unspecified cervical region: Secondary | ICD-10-CM

## 2015-02-15 ENCOUNTER — Ambulatory Visit (HOSPITAL_COMMUNITY): Payer: BLUE CROSS/BLUE SHIELD | Admitting: Physical Therapy

## 2015-02-18 ENCOUNTER — Encounter (HOSPITAL_COMMUNITY): Payer: Self-pay | Admitting: *Deleted

## 2015-02-18 ENCOUNTER — Emergency Department (HOSPITAL_COMMUNITY)
Admission: EM | Admit: 2015-02-18 | Discharge: 2015-02-18 | Disposition: A | Discharge status: Home / Self Care | Payer: BLUE CROSS/BLUE SHIELD | Attending: Emergency Medicine | Admitting: Emergency Medicine

## 2015-02-18 ENCOUNTER — Emergency Department (HOSPITAL_COMMUNITY): Payer: BLUE CROSS/BLUE SHIELD

## 2015-02-18 DIAGNOSIS — K5900 Constipation, unspecified: Secondary | ICD-10-CM

## 2015-02-18 DIAGNOSIS — M62838 Other muscle spasm: Secondary | ICD-10-CM | POA: Diagnosis not present

## 2015-02-18 DIAGNOSIS — R0789 Other chest pain: Secondary | ICD-10-CM | POA: Diagnosis not present

## 2015-02-18 DIAGNOSIS — M542 Cervicalgia: Secondary | ICD-10-CM | POA: Diagnosis not present

## 2015-02-18 DIAGNOSIS — G8929 Other chronic pain: Secondary | ICD-10-CM | POA: Insufficient documentation

## 2015-02-18 DIAGNOSIS — Z79899 Other long term (current) drug therapy: Secondary | ICD-10-CM | POA: Diagnosis not present

## 2015-02-18 DIAGNOSIS — F419 Anxiety disorder, unspecified: Secondary | ICD-10-CM | POA: Insufficient documentation

## 2015-02-18 DIAGNOSIS — K529 Noninfective gastroenteritis and colitis, unspecified: Secondary | ICD-10-CM | POA: Insufficient documentation

## 2015-02-18 DIAGNOSIS — Z8709 Personal history of other diseases of the respiratory system: Secondary | ICD-10-CM | POA: Insufficient documentation

## 2015-02-18 DIAGNOSIS — K2970 Gastritis, unspecified, without bleeding: Secondary | ICD-10-CM

## 2015-02-18 LAB — CBC WITH DIFFERENTIAL/PLATELET
Basophils Absolute: 0 10*3/uL (ref 0.0–0.1)
Basophils Relative: 0 % (ref 0–1)
EOS ABS: 0.4 10*3/uL (ref 0.0–0.7)
EOS PCT: 4 % (ref 0–5)
HEMATOCRIT: 38.6 % (ref 36.0–46.0)
Hemoglobin: 13.3 g/dL (ref 12.0–15.0)
LYMPHS ABS: 3.9 10*3/uL (ref 0.7–4.0)
LYMPHS PCT: 32 % (ref 12–46)
MCH: 30.8 pg (ref 26.0–34.0)
MCHC: 34.5 g/dL (ref 30.0–36.0)
MCV: 89.4 fL (ref 78.0–100.0)
MONO ABS: 0.8 10*3/uL (ref 0.1–1.0)
Monocytes Relative: 6 % (ref 3–12)
Neutro Abs: 7.1 10*3/uL (ref 1.7–7.7)
Neutrophils Relative %: 58 % (ref 43–77)
Platelets: 474 10*3/uL — ABNORMAL HIGH (ref 150–400)
RBC: 4.32 MIL/uL (ref 3.87–5.11)
RDW: 13 % (ref 11.5–15.5)
WBC: 12.2 10*3/uL — ABNORMAL HIGH (ref 4.0–10.5)

## 2015-02-18 LAB — COMPREHENSIVE METABOLIC PANEL
ALT: 20 U/L (ref 0–35)
AST: 23 U/L (ref 0–37)
Albumin: 4.1 g/dL (ref 3.5–5.2)
Alkaline Phosphatase: 45 U/L (ref 39–117)
Anion gap: 9 (ref 5–15)
BUN: 13 mg/dL (ref 6–23)
CALCIUM: 9 mg/dL (ref 8.4–10.5)
CO2: 25 mmol/L (ref 19–32)
Chloride: 107 mmol/L (ref 96–112)
Creatinine, Ser: 0.68 mg/dL (ref 0.50–1.10)
GFR calc Af Amer: >90 mL/min (ref >90)
GFR calc non Af Amer: >90 mL/min (ref >90)
Glucose, Bld: 84 mg/dL (ref 70–99)
Potassium: 3.6 mmol/L (ref 3.5–5.1)
SODIUM: 141 mmol/L (ref 135–145)
TOTAL PROTEIN: 6.6 g/dL (ref 6.0–8.3)
Total Bilirubin: 0.4 mg/dL (ref 0.3–1.2)

## 2015-02-18 LAB — URINALYSIS, ROUTINE W REFLEX MICROSCOPIC
Bilirubin Urine: NEGATIVE
Glucose, UA: NEGATIVE mg/dL
Hgb urine dipstick: NEGATIVE
Ketones, ur: NEGATIVE mg/dL
LEUKOCYTES UA: NEGATIVE
NITRITE: NEGATIVE
PH: 5.5 (ref 5.0–8.0)
Protein, ur: NEGATIVE mg/dL
SPECIFIC GRAVITY, URINE: 1.02 (ref 1.005–1.030)
Urobilinogen, UA: 0.2 mg/dL (ref 0.0–1.0)

## 2015-02-18 LAB — LIPASE, BLOOD: LIPASE: 37 U/L (ref 11–59)

## 2015-02-18 LAB — POC URINE PREG, ED: PREG TEST UR: NEGATIVE

## 2015-02-18 MED ORDER — OXYCODONE-ACETAMINOPHEN 5-325 MG PO TABS: 1.0000 | ORAL_TABLET | ORAL | Status: DC | PRN

## 2015-02-18 MED ORDER — KETOROLAC TROMETHAMINE 30 MG/ML IJ SOLN
INTRAMUSCULAR | Status: AC
  Filled 2015-02-18: qty 1

## 2015-02-18 MED ORDER — POLYETHYLENE GLYCOL 3350 17 G PO PACK: 17.0000 g | PACK | Freq: Every day | ORAL | Status: DC

## 2015-02-18 MED ORDER — OMEPRAZOLE 40 MG PO CPDR: 40.0000 mg | DELAYED_RELEASE_CAPSULE | Freq: Every day | ORAL | Status: DC

## 2015-02-18 MED ORDER — SODIUM CHLORIDE 0.9 % IV BOLUS (SEPSIS)
1000.0000 mL | Freq: Once | INTRAVENOUS | Status: AC
Start: 2015-02-18 — End: 2015-02-18
  Administered 2015-02-18: 1000 mL via INTRAVENOUS

## 2015-02-18 MED ORDER — MORPHINE SULFATE 4 MG/ML IJ SOLN
4.0000 mg | Freq: Once | INTRAMUSCULAR | Status: AC
  Administered 2015-02-18: 4 mg via INTRAVENOUS
  Filled 2015-02-18: qty 1

## 2015-02-18 MED ORDER — KETOROLAC TROMETHAMINE 30 MG/ML IJ SOLN
30.0000 mg | Freq: Once | INTRAMUSCULAR | Status: AC
  Administered 2015-02-18: 30 mg via INTRAVENOUS

## 2015-02-18 MED ORDER — DOCUSATE SODIUM 100 MG PO CAPS: 100.0000 mg | ORAL_CAPSULE | Freq: Two times a day (BID) | ORAL | Status: DC

## 2015-02-18 MED ORDER — LORAZEPAM 2 MG/ML IJ SOLN
1.0000 mg | Freq: Once | INTRAMUSCULAR | Status: AC
  Administered 2015-02-18: 1 mg via INTRAVENOUS
  Filled 2015-02-18: qty 1

## 2015-02-18 MED ORDER — ONDANSETRON HCL 4 MG/2ML IJ SOLN
4.0000 mg | Freq: Once | INTRAMUSCULAR | Status: AC
  Administered 2015-02-18: 4 mg via INTRAVENOUS
  Filled 2015-02-18: qty 2

## 2015-02-18 MED ORDER — OXYCODONE-ACETAMINOPHEN 5-325 MG PO TABS
2.0000 | ORAL_TABLET | Freq: Once | ORAL | Status: AC
  Administered 2015-02-18: 2 via ORAL
  Filled 2015-02-18: qty 2

## 2015-02-18 NOTE — Discharge Instructions (Signed)
Cervical Radiculopathy Cervical radiculopathy happens when a nerve in the neck is pinched or bruised by a slipped (herniated) disk or by arthritic changes in the bones of the cervical spine. This can occur due to an injury or as part of the normal aging process. Pressure on the cervical nerves can cause pain or numbness that runs from your neck all the way down into your arm and fingers. CAUSES  There are many possible causes, including:  Injury.  Muscle tightness in the neck from overuse.  Swollen, painful joints (arthritis).  Breakdown or degeneration in the bones and joints of the spine (spondylosis) due to aging.  Bone spurs that may develop near the cervical nerves. SYMPTOMS  Symptoms include pain, weakness, or numbness in the affected arm and hand. Pain can be severe or irritating. Symptoms may be worse when extending or turning the neck. DIAGNOSIS  Your caregiver will ask about your symptoms and do a physical exam. He or she may test your strength and reflexes. X-rays, CT scans, and MRI scans may be needed in cases of injury or if the symptoms do not go away after a period of time. Electromyography (EMG) or nerve conduction testing may be done to study how your nerves and muscles are working. TREATMENT  Your caregiver may recommend certain exercises to help relieve your symptoms. Cervical radiculopathy can, and often does, get better with time and treatment. If your problems continue, treatment options may include:  Wearing a soft collar for short periods of time.  Physical therapy to strengthen the neck muscles.  Medicines, such as nonsteroidal anti-inflammatory drugs (NSAIDs), oral corticosteroids, or spinal injections.  Surgery. Different types of surgery may be done depending on the cause of your problems. HOME CARE INSTRUCTIONS   Put ice on the affected area.  Put ice in a plastic bag.  Place a towel between your skin and the bag.  Leave the ice on for 15-20 minutes,  03-04 times a day or as directed by your caregiver.  If ice does not help, you can try using heat. Take a warm shower or bath, or use a hot water bottle as directed by your caregiver.  You may try a gentle neck and shoulder massage.  Use a flat pillow when you sleep.  Only take over-the-counter or prescription medicines for pain, discomfort, or fever as directed by your caregiver.  If physical therapy was prescribed, follow your caregiver's directions.  If a soft collar was prescribed, use it as directed. SEEK IMMEDIATE MEDICAL CARE IF:   Your pain gets much worse and cannot be controlled with medicines.  You have weakness or numbness in your hand, arm, face, or leg.  You have a high fever or a stiff, rigid neck.  You lose bowel or bladder control (incontinence).  You have trouble with walking, balance, or speaking. MAKE SURE YOU:   Understand these instructions.  Will watch your condition.  Will get help right away if you are not doing well or get worse. Document Released: 07/18/2001 Document Revised: 01/15/2012 Document Reviewed: 06/06/2011 [facility] Patient Information 2015 [place], [place]. This information is not intended to replace advice given to you by your health care provider. Make sure you discuss any questions you have with your health care provider.  Chest Pain (Nonspecific) It is often hard to give a specific diagnosis for the cause of chest pain. There is always a chance that your pain could be related to something serious, such as a heart attack or a blood clot  in the lungs. You need to follow up with your health care provider for further evaluation. CAUSES   Heartburn.  Pneumonia or bronchitis.  Anxiety or stress.  Inflammation around your heart (pericarditis) or lung (pleuritis or pleurisy).  A blood clot in the lung.  A collapsed lung (pneumothorax). It can develop suddenly on its own (spontaneous pneumothorax) or from trauma to the chest.  Shingles  infection (herpes zoster virus). The chest wall is composed of bones, muscles, and cartilage. Any of these can be the source of the pain.  The bones can be bruised by injury.  The muscles or cartilage can be strained by coughing or overwork.  The cartilage can be affected by inflammation and become sore (costochondritis). DIAGNOSIS  Lab tests or other studies may be needed to find the cause of your pain. Your health care provider may have you take a test called an ambulatory electrocardiogram (ECG). An ECG records your heartbeat patterns over a 24-hour period. You may also have other tests, such as:  Transthoracic echocardiogram (TTE). During echocardiography, sound waves are used to evaluate how blood flows through your heart.  Transesophageal echocardiogram (TEE).  Cardiac monitoring. This allows your health care provider to monitor your heart rate and rhythm in real time.  Holter monitor. This is a portable device that records your heartbeat and can help diagnose heart arrhythmias. It allows your health care provider to track your heart activity for several days, if needed.  Stress tests by exercise or by giving medicine that makes the heart beat faster. TREATMENT   Treatment depends on what may be causing your chest pain. Treatment may include:  Acid blockers for heartburn.  Anti-inflammatory medicine.  Pain medicine for inflammatory conditions.  Antibiotics if an infection is present.  You may be advised to change lifestyle habits. This includes stopping smoking and avoiding alcohol, caffeine, and chocolate.  You may be advised to keep your head raised (elevated) when sleeping. This reduces the chance of acid going backward from your stomach into your esophagus. Most of the time, nonspecific chest pain will improve within 2-3 days with rest and mild pain medicine.  HOME CARE INSTRUCTIONS   If antibiotics were prescribed, take them as directed. Finish them even if you start  to feel better.  For the next few days, avoid physical activities that bring on chest pain. Continue physical activities as directed.  Do not use any tobacco products, including cigarettes, chewing tobacco, or electronic cigarettes.  Avoid drinking alcohol.  Only take medicine as directed by your health care provider.  Follow your health care provider's suggestions for further testing if your chest pain does not go away.  Keep any follow-up appointments you made. If you do not go to an appointment, you could develop lasting (chronic) problems with pain. If there is any problem keeping an appointment, call to reschedule. SEEK MEDICAL CARE IF:   Your chest pain does not go away, even after treatment.  You have a rash with blisters on your chest.  You have a fever. SEEK IMMEDIATE MEDICAL CARE IF:   You have increased chest pain or pain that spreads to your arm, neck, jaw, back, or abdomen.  You have shortness of breath.  You have an increasing cough, or you cough up blood.  You have severe back or abdominal pain.  You feel nauseous or vomit.  You have severe weakness.  You faint.  You have chills. This is an emergency. Do not wait to see if the pain  will go away. Get medical help at once. Call your local emergency services (911 in [place] Do not drive yourself to the hospital. MAKE SURE YOU:   Understand these instructions.  Will watch your condition.  Will get help right away if you are not doing well or get worse. Document Released: 08/02/2005 Document Revised: 10/28/2013 Document Reviewed: 05/28/2008 [facility] Patient Information 2015 [place], [place]. This information is not intended to replace advice given to you by your health care provider. Make sure you discuss any questions you have with your health care provider.  Gastritis, Adult Gastritis is soreness and swelling (inflammation) of the lining of the stomach. Gastritis can develop as a sudden onset (acute) or  long-term (chronic) condition. If gastritis is not treated, it can lead to stomach bleeding and ulcers. CAUSES  Gastritis occurs when the stomach lining is weak or damaged. Digestive juices from the stomach then inflame the weakened stomach lining. The stomach lining may be weak or damaged due to viral or bacterial infections. One common bacterial infection is the Helicobacter pylori infection. Gastritis can also result from excessive alcohol consumption, taking certain medicines, or having too much acid in the stomach.  SYMPTOMS  In some cases, there are no symptoms. When symptoms are present, they may include:  Pain or a burning sensation in the upper abdomen.  Nausea.  Vomiting.  An uncomfortable feeling of fullness after eating. DIAGNOSIS  Your caregiver may suspect you have gastritis based on your symptoms and a physical exam. To determine the cause of your gastritis, your caregiver may perform the following:  Blood or stool tests to check for the H pylori bacterium.  Gastroscopy. A thin, flexible tube (endoscope) is passed down the esophagus and into the stomach. The endoscope has a light and camera on the end. Your caregiver uses the endoscope to view the inside of the stomach.  Taking a tissue sample (biopsy) from the stomach to examine under a microscope. TREATMENT  Depending on the cause of your gastritis, medicines may be prescribed. If you have a bacterial infection, such as an H pylori infection, antibiotics may be given. If your gastritis is caused by too much acid in the stomach, H2 blockers or antacids may be given. Your caregiver may recommend that you stop taking aspirin, ibuprofen, or other nonsteroidal anti-inflammatory drugs (NSAIDs). HOME CARE INSTRUCTIONS  Only take over-the-counter or prescription medicines as directed by your caregiver.  If you were given antibiotic medicines, take them as directed. Finish them even if you start to feel better.  Drink enough  fluids to keep your urine clear or pale yellow.  Avoid foods and drinks that make your symptoms worse, such as:  Caffeine or alcoholic drinks.  Chocolate.  Peppermint or mint flavorings.  Garlic and onions.  Spicy foods.  Citrus fruits, such as oranges, lemons, or limes.  Tomato-based foods such as sauce, chili, salsa, and pizza.  Fried and fatty foods.  Eat small, frequent meals instead of large meals. SEEK IMMEDIATE MEDICAL CARE IF:   You have black or dark red stools.  You vomit blood or material that looks like coffee grounds.  You are unable to keep fluids down.  Your abdominal pain gets worse.  You have a fever.  You do not feel better after 1 week.  You have any other questions or concerns. MAKE SURE YOU:  Understand these instructions.  Will watch your condition.  Will get help right away if you are not doing well or get worse. Document Released:  10/17/2001 Document Revised: 04/23/2012 Document Reviewed: 12/06/2011 [employer] Patient Information 2015 [place], [place]. This information is not intended to replace advice given to you by your health care provider. Make sure you discuss any questions you have with your health care provider.    Constipation Constipation is when a person has fewer than three bowel movements a week, has difficulty having a bowel movement, or has stools that are dry, hard, or larger than normal. As people grow older, constipation is more common. If you try to fix constipation with medicines that make you have a bowel movement (laxatives), the problem may get worse. Long-term laxative use may cause the muscles of the colon to become weak. A low-fiber diet, not taking in enough fluids, and taking certain medicines may make constipation worse.  CAUSES   Certain medicines, such as antidepressants, pain medicine, iron supplements, antacids, and water pills.   Certain diseases, such as diabetes, irritable bowel syndrome (IBS), thyroid  disease, or depression.   Not drinking enough water.   Not eating enough fiber-rich foods.   Stress or travel.   Lack of physical activity or exercise.   Ignoring the urge to have a bowel movement.   Using laxatives too much.  SIGNS AND SYMPTOMS   Having fewer than three bowel movements a week.   Straining to have a bowel movement.   Having stools that are hard, dry, or larger than normal.   Feeling full or bloated.   Pain in the lower abdomen.   Not feeling relief after having a bowel movement.  DIAGNOSIS  Your health care provider will take a medical history and perform a physical exam. Further testing may be done for severe constipation. Some tests may include:  A barium enema X-ray to examine your rectum, colon, and, sometimes, your small intestine.   A sigmoidoscopy to examine your lower colon.   A colonoscopy to examine your entire colon. TREATMENT  Treatment will depend on the severity of your constipation and what is causing it. Some dietary treatments include drinking more fluids and eating more fiber-rich foods. Lifestyle treatments may include regular exercise. If these diet and lifestyle recommendations do not help, your health care provider may recommend taking over-the-counter laxative medicines to help you have bowel movements. Prescription medicines may be prescribed if over-the-counter medicines do not work.  HOME CARE INSTRUCTIONS   Eat foods that have a lot of fiber, such as fruits, vegetables, whole grains, and beans.  Limit foods high in fat and processed sugars, such as french fries, hamburgers, cookies, candies, and soda.   A fiber supplement may be added to your diet if you cannot get enough fiber from foods.   Drink enough fluids to keep your urine clear or pale yellow.   Exercise regularly or as directed by your health care provider.   Go to the restroom when you have the urge to go. Do not hold it.   Only take  over-the-counter or prescription medicines as directed by your health care provider. Do not take other medicines for constipation without talking to your health care provider first.  [place] IF:   You have bright red blood in your stool.   Your constipation lasts for more than 4 days or gets worse.   You have abdominal or rectal pain.   You have thin, pencil-like stools.   You have unexplained weight loss. MAKE SURE YOU:   Understand these instructions.  Will watch your condition.  Will get help right away  if you are not doing well or get worse. Document Released: 07/21/2004 Document Revised: 10/28/2013 Document Reviewed: 08/04/2013 [facility] Patient Information 2015 [place], [place]. This information is not intended to replace advice given to you by your health care provider. Make sure you discuss any questions you have with your health care provider.

## 2015-02-18 NOTE — ED Notes (Signed)
Pt verbalized understanding of no driving and to use caution within 4 hours of taking pain meds due to meds cause drowsiness 

## 2015-02-18 NOTE — ED Provider Notes (Signed)
TIME SEEN: 6:15 PM  CHIEF COMPLAINT: Neck pain, chest pain, abdominal pain  HPI: Pt is a 34 y.o. female with history of anxiety, recent diagnosis of a C5-C6 herniated disc who is receiving epidural injections by an orthopedist physician on [place] (she can not recall the name) who presents to the emergency department with complaints of increasing left-sided neck pain that radiates down to her chest, abdominal pain, nausea, anxiety since receiving an epidural injection on Friday, April 8. She reports this was her first epidural injection. She denies that she's had any fever. She has had numbness and tingling in the left arm since pain started in January that is unchanged. No new numbness or focal weakness. No bowel or bladder incontinence. No difficulty walking. Reports she feels that there is so much pressure in her back that if she could "make it pop" it would feel much better. States pain wakes her up from sleep. She has also had chest pressure without shortness of breath and epigastric pain that she describes as feeling like "gas pain". No vomiting or diarrhea. No bloody stool or melena. She is taking ibuprofen 800 mg for her pain.  No dysuria, hematuria, vaginal bleeding or discharge. She has an IUD in her last menstrual period was 2 years ago. Denies any new injury to her neck. States that she does have some similar symptoms of chest pain with her anxiety attacks.  States she has follow-up with her orthopedic physician on April 25. Has an appointment with a physical therapist on April 19.   She denies a history of hypertension, diabetes, hyperlipidemia, tobacco use or family history of premature CAD. Denies history of PE or DVT. Not on exogenous estrogen. Has Mirena IUD. No recent prolonged immobilization such as long flight or hospitals, fracture, surgery, trauma.  ROS: See HPI Constitutional: no fever  Eyes: no drainage  ENT: no runny nose   Cardiovascular:   chest pain  Resp: no SOB   GI: no vomiting GU: no dysuria Integumentary: no rash  Allergy: no hives  Musculoskeletal: no leg swelling  Neurological: no slurred speech ROS otherwise negative  PAST MEDICAL HISTORY/PAST SURGICAL HISTORY:  Past Medical History  Diagnosis Date  . Gallstones   . Anxiety   . Carpal tunnel syndrome, bilateral   . Seasonal allergies   . Panic attacks   . Constipation     MEDICATIONS:  Prior to Admission medications   Medication Sig Start Date End Date Taking? Authorizing Provider  ibuprofen (ADVIL,MOTRIN) 800 MG tablet Take 800-1,600 mg by mouth every 6 (six) hours as needed for mild pain or moderate pain.  11/18/14  Yes Historical Provider, MD  levonorgestrel (MIRENA) 20 MCG/24HR IUD 1 each by Intrauterine route once.    Historical Provider, MD  methocarbamol (ROBAXIN) 500 MG tablet Take 1 tablet (500 mg total) by mouth 2 (two) times daily. Patient not taking: Reported on 02/18/2015 01/04/15   Ashley Murrain, NP  traMADol (ULTRAM) 50 MG tablet Take 1 tablet (50 mg total) by mouth every 6 (six) hours as needed. Patient not taking: Reported on 02/18/2015 01/04/15   Ashley Murrain, NP    ALLERGIES:  Allergies  Allergen Reactions  . Vicodin [Hydrocodone-Acetaminophen] Itching    Headache Upset stomach    SOCIAL HISTORY:  History  Substance Use Topics  . Smoking status: Never Smoker   . Smokeless tobacco: Not on file  . Alcohol Use: No    FAMILY HISTORY: History reviewed. No pertinent family history.  EXAM: BP 131/86 mmHg  Pulse 82  Temp(Src) 98.3 F (36.8 C) (Oral)  Resp 18  Ht 5\' 4"  (1.626 m)  Wt 156 lb (70.761 kg)  BMI 26.76 kg/m2  SpO2 100% CONSTITUTIONAL: Alert and oriented and responds appropriately to questions. Appears uncomfortable but nontoxic, well-hydrated, tearful, anxious HEAD: Normocephalic EYES: Conjunctivae clear, PERRL ENT: normal nose; no rhinorrhea; moist mucous membranes; pharynx without lesions noted NECK: Supple, no meningismus, no LAD; no  midline spinal tenderness or step-off or deformity. Patient is exquisitely tender to palpation over her left trapezius muscle without associated erythema or warmth and no lesions noted in this area the pain does radiate down into her chest and you press on the posterior left trapezius muscle, there is associated muscle spasm CARD: RRR; S1 and S2 appreciated; no murmurs, no clicks, no rubs, no gallops RESP: Normal chest excursion without splinting or tachypnea; breath sounds clear and equal bilaterally; no wheezes, no rhonchi, no rales, no hypoxia or respiratory distress, chest wall is nontender to palpation ABD/GI: Normal bowel sounds; non-distended; soft, mildly tender to palpation in the epigastric region without guarding or rebound, no apparent signs, patient is status post cholecystectomy, negative Murphy sign, no tenderness at McBurney's point BACK:  The back appears normal and is non-tender to palpation, there is no CVA tenderness EXT: Normal ROM in all joints; non-tender to palpation; no edema; normal capillary refill; no cyanosis    SKIN: Normal color for age and race; warm NEURO: Moves all extremities equally strength 5/5 in all 4 choice, sensation to light touch intact diffusely, cranial nerves II through XII intact, 2+ D2 reflexes in bilateral upper and lower extremities, no clonus PSYCH: The patient's mood and manner are appropriate. Grooming and personal hygiene are appropriate.  MEDICAL DECISION MAKING: Patient here with multiple complaints. I suspect most of this is secondary to anxiety and her chronic neck pain. She has associated left sided trapezius muscle spasm. She has a heart score of 0 and is PERC negative.    She is on NSAIDs regularly and is complaining of epigastric pain but has a non-peritoneal abdomen and I doubt that she has a perforated ulcer although gastritis, peptic ulcer disease, GERD could cause her symptoms. Will obtain abdominal labs, acute abdominal series. We'll give  medicine for pain and nausea in the ED. Doubt that she has any epidural hematoma or abscess. She has no midline tenderness and has no neurologic deficits. No fever. Afebrile in the emergency department.  ED PROGRESS: Patient reports her pain is improved. She does have a mild leukocytosis without left shift. Creatinine, LFTs, lipase normal. Urine shows no sign of infection. She is not pregnant. Abdominal x-ray shows mildly prominent loops of small bowel in the left mid abdomen likely secondary to enteritis or ileus and less likely a partial bowel obstruction. She's having normal bowel movements and no vomiting. No distention, tympany. No prior history of bowel obstruction. She is passing gas. Have very low suspicion for bowel obstruction and do not feel she needs a CT scan at this time. She is appear constipated. There is no sign of pneumonia, effusion, pulmonary edema. We'll discharge with prescription for Percocet, Robaxin and have her continue ibuprofen but only take this with food. Suspect some of her epigastric pain they be secondary to gastritis induced by NSAIDs. Will discharge on Prilosec as well. We'll have her follow-up with her orthopedic surgeon as scheduled. Discussed return precautions. She verbalized understanding and is comfortable with plan.  EKG Interpretation  Date/Time:  Thursday February 18 2015 18:29:08 EDT Ventricular Rate:  68 PR Interval:  130 QRS Duration: 70 QT Interval:  408 QTC Calculation: 433 R Axis:   59 Text Interpretation:  Normal sinus rhythm Normal ECG Confirmed by WARD,  DO, KRISTEN ([id number]) on 02/18/2015 6:35:10 PM        [place], DO 02/18/15 2119

## 2015-02-18 NOTE — ED Notes (Signed)
Pain in neck after injection for neck pain,  Chest pain, abd pain., back pain

## 2015-02-19 ENCOUNTER — Other Ambulatory Visit: Payer: Self-pay | Admitting: *Deleted

## 2015-02-19 MED ORDER — OSELTAMIVIR PHOSPHATE 75 MG PO CAPS: 75.0000 mg | ORAL_CAPSULE | Freq: Every day | ORAL | Status: DC

## 2015-02-23 ENCOUNTER — Ambulatory Visit (HOSPITAL_COMMUNITY): Payer: BLUE CROSS/BLUE SHIELD | Attending: Physical Medicine and Rehabilitation | Admitting: Physical Therapy

## 2015-02-23 DIAGNOSIS — M436 Torticollis: Secondary | ICD-10-CM | POA: Insufficient documentation

## 2015-02-23 DIAGNOSIS — M542 Cervicalgia: Secondary | ICD-10-CM | POA: Diagnosis not present

## 2015-02-23 DIAGNOSIS — R29898 Other symptoms and signs involving the musculoskeletal system: Secondary | ICD-10-CM | POA: Insufficient documentation

## 2015-02-23 DIAGNOSIS — M25612 Stiffness of left shoulder, not elsewhere classified: Secondary | ICD-10-CM | POA: Diagnosis not present

## 2015-02-23 DIAGNOSIS — M25512 Pain in left shoulder: Secondary | ICD-10-CM | POA: Insufficient documentation

## 2015-02-23 DIAGNOSIS — R293 Abnormal posture: Secondary | ICD-10-CM | POA: Diagnosis not present

## 2015-02-23 NOTE — Therapy (Signed)
[facility], [place], [address] Phone: [phone]   Fax:  [phone]  Physical Therapy Evaluation  Patient Details  Name: Tiffany Ho MRN: [id number] Date of Birth: 1981/05/28 Referring Provider:  Laroy Apple, MD  Encounter Date: 02/23/2015      PT End of Session - 02/23/15 1215    Visit Number 1   Number of Visits 16   Date for PT Re-Evaluation 03/25/15   Authorization Type BCBS   Authorization Time Period 02/23/15 - 04/25/15   Authorization - Visit Number 1   Authorization - Number of Visits 16   PT Start Time 1104   PT Stop Time 1148   PT Time Calculation (min) 44 min   Activity Tolerance Patient tolerated treatment well   Behavior During Therapy [facility] for tasks assessed/performed      Past Medical History  Diagnosis Date  . Gallstones   . Anxiety   . Carpal tunnel syndrome, bilateral   . Seasonal allergies   . Panic attacks   . Constipation     Past Surgical History  Procedure Laterality Date  . Cesarean section    . Cholecystectomy  07/29/2012    Procedure: LAPAROSCOPIC CHOLECYSTECTOMY WITH INTRAOPERATIVE CHOLANGIOGRAM;  Surgeon: Adin Hector, MD;  Location: [facility];  Service: General;  Laterality: N/A;  . Neck pain      There were no vitals filed for this visit.  Visit Diagnosis:  Pain in joint, shoulder region, left - Plan: PT plan of care cert/re-cert  Cervicalgia - Plan: PT plan of care cert/re-cert  Weakness of left upper extremity - Plan: PT plan of care cert/re-cert  Neck stiffness - Plan: PT plan of care cert/re-cert  Shoulder stiffness, left - Plan: PT plan of care cert/re-cert  Bad posture - Plan: PT plan of care cert/re-cert      Subjective Assessment - 02/23/15 1105    Pertinent History Patient began having Lt neck and shoulder pain about 1 month ago. patient also injrued same shoulder 1 year ago while walking dogs, but pain improved miimally but never really  improved. patient had x-ray/RMRI finding bulging Discs at C5-C6. 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 Patient had a shoulder injection for pain that did not increase. patient recently went to ER due to increased pain gfor which MD attributed to pain. history of anxiety.   Limitations Sitting   How long can you sit comfortably? unable to sit still   How long can you stand comfortably? unable to stand still   How long can you walk comfortably? no feels good   Diagnostic tests MRI/Xray indicating C5-6 bulging disc   Patient Stated Goals to decrease pain. Patient can sleep through the night. so patient can drive with out pain and look over shoulder withtou pain.    Currently in Pain? Yes   Pain Score 7    Pain Location Shoulder   Pain Orientation Left   Pain Descriptors / Indicators Pressure;Dull   Pain Type Chronic pain   Pain Radiating Towards neck tinto shoudler   Pain Frequency Constant   Aggravating Factors  sitting still   Pain Relieving Factors constantly moving.    Effect of Pain on Daily Activities patient unable to sleep >6 hours of sleep patient wakes at least 2x a night and will stay awake.             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  No   Is the patient reluctant to leave their home because of a fear of falling?  No   Prior Function   Level of Independence Independent with basic ADLs   Observation/Other Assessments   Focus on Therapeutic Outcomes (FOTO)  565 limited   Posture/Postural Control   Posture Comments Forward head, limited throacic kyphosis, lordosis limited, forward rounded shoulders.    ROM / Strength   AROM / PROM / Strength  AROM;Strength   AROM   Overall AROM Comments zLumbar spine: ROM WNL   AROM Assessment Site Shoulder;Cervical;Thoracic   Right/Left Shoulder Right;Left   Right Shoulder Flexion 180 Degrees   Right Shoulder ABduction 180 Degrees   Right Shoulder Horizontal ABduction 30 Degrees   Left Shoulder Flexion 180 Degrees   Left Shoulder ABduction 180 Degrees   Left Shoulder Horizontal ABduction 20 Degrees   Cervical Flexion 70   Cervical Extension 65   Cervical - Right Side Bend 45   Cervical - Left Side Bend 45   Cervical - Right Rotation 70   Cervical - Left Rotation 62   Thoracic Flexion 80   Thoracic Extension 3   Thoracic - Right Side Bend 0.   Thoracic - Right Rotation 60   Thoracic - Left Rotation 60   Strength   Strength Assessment Site Shoulder;Cervical;Thoracic   Right/Left Shoulder Right;Left   Right Shoulder Flexion 5/5   Right Shoulder ABduction 5/5   Left Shoulder Flexion 4/5   Left Shoulder ABduction 4/5   Thoracic Flexion 4+/5   Thoracic Extension 2+/5   Palpation   Palpation limited scalenes Pectoral and upper trapezius mobility   Special Tests    Special Tests Thoracic Outlet Syndrome;Rotator Cuff Impingement   Thoracic Outlet Syndrome  Arms overhead;Adson Test   Rotator Cuff Impingment tests Michel Bickers test;Empty Can test   Adson Test   Findings Positive   Side  Left   Arms overhead    Findings Postive   Side Left   Hawkins-Kennedy test   Findings Negative   Empty Can test   Findings Negative                           PT Education - 02/23/15 1232    Education provided Yes   Education Details Prognosis, Diagnosis, and HEP: THoracic spine excursions and cervical spine extensions 2 to 3x a day.    Person(s) Educated Patient   Methods Explanation;Demonstration;Handout   Comprehension Verbalized understanding;Returned demonstration          PT Short Term Goals - 02/23/15 1251    PT SHORT TERM GOAL #1   Title Patient will  dmeonstrate thoracic spine extension of 20 degrees to improve upright posture   Time 4   Period Weeks   Status New   PT SHORT TERM GOAL #2   Title Patient will demonstrate increased cervical spine rotation to Lt to 75 degrees to more easily look over shoulder to look into blind spot while driving.    Time 4   Period Weeks   Status New   PT  SHORT TERM GOAL #3   Title Patient will demonstrate decreased pain to 3/10 while sitting for <10minutes   Time 4   Period Weeks   Status New   PT SHORT TERM GOAL #4   Title Patient will be independent with HEP.    Time 4   Period Weeks   Status New           PT Long Term Goals - 02/23/15 1255    PT LONG TERM GOAL #1   Title Patient will dmeosntrate Lt UE horizontal abduction of 30 degrees indicaing improved pectoral mobility to more easily reach behind self.    Time 8   Period Weeks   Status New   PT LONG TERM GOAL #2   Title Patient will demonstrate increased cervical spine rotation bilaterally to 80 degrees to more easily look over shoulders to look into blind spot while driving.    Time 8   Period Weeks   Status New   PT LONG TERM GOAL #3   Title Patient will demonstrate Lt shoulder strength grossly 5/5 to be able to return to regular exercise routine   Time 8   Period Weeks   Status New   PT LONG TERM GOAL #4   Title patient will be able to sleep 5 days a week through night waking only to use restroom   Time 8   Period Weeks   Status New   PT LONG TERM GOAL #5   Title patient will be able to sit >2 hours withotu pain to drive long distances.    Time 8   Period Weeks   Status New               Plan - 02/23/15 1235    Clinical Impression Statement Patient displays cervical spine and Lt shoudler pain secondary to abnormal posture and trunk/neck stiffness hernitated nucleus pulposis (per MD). Patient will benefit from skileld physical therapy to decrease neck stiffness and increase ability to sit without pain. Specific  focu initially to be placed on decreasing pain via AROM extension based exercises.    Pt will benefit from skilled therapeutic intervention in order to improve on the following deficits Decreased strength;Pain;Decreased activity tolerance;Increased fascial restricitons;Improper body mechanics;Postural dysfunction;Impaired flexibility   Rehab Potential Good   PT Frequency 2x / week   PT Duration 8 weeks   PT Treatment/Interventions Therapeutic exercise;Patient/family education;Manual techniques;Neuromuscular re-education  thoracic and cervical spine manipulation techniques   PT Next Visit Plan Introduce upper trapezius stretch, Pectoral stretch,rhomboid, and anterior shoulder streches.  Dowel pendulems with functional manual reactions. below shoulder height common shoulder dumbbell matrix, as pain improves progress to more functional strengthening. Initiate next session with soft tissue mobilization and thoracic spine joint mobilization   PT Home Exercise Plan 3D thoracic spine excursions, sagittal plane cervical spine excursions   Consulted and Agree with Plan of Care Patient         Problem List Patient Active Problem List   Diagnosis Date Noted  . Hair loss 05/21/2014  . Other malaise and fatigue 05/21/2014  . Pain in joint, shoulder region 12/01/2013  . Cervicalgia 12/01/2013  . Gallstones 07/16/2012   Devona Konig PT DPT [place], [place], [address] Phone: [phone]   Fax:  [phone]

## 2015-03-10 ENCOUNTER — Ambulatory Visit (HOSPITAL_COMMUNITY): Payer: BLUE CROSS/BLUE SHIELD | Attending: Physical Medicine and Rehabilitation

## 2015-03-10 DIAGNOSIS — R293 Abnormal posture: Secondary | ICD-10-CM | POA: Insufficient documentation

## 2015-03-10 DIAGNOSIS — M542 Cervicalgia: Secondary | ICD-10-CM | POA: Insufficient documentation

## 2015-03-10 DIAGNOSIS — M25512 Pain in left shoulder: Secondary | ICD-10-CM

## 2015-03-10 DIAGNOSIS — R29898 Other symptoms and signs involving the musculoskeletal system: Secondary | ICD-10-CM | POA: Insufficient documentation

## 2015-03-10 DIAGNOSIS — M25612 Stiffness of left shoulder, not elsewhere classified: Secondary | ICD-10-CM

## 2015-03-10 DIAGNOSIS — M436 Torticollis: Secondary | ICD-10-CM

## 2015-03-10 NOTE — Therapy (Signed)
[place], [place], [address] Phone: [phone]   Fax:  [phone]  Physical Therapy Treatment  Patient Details  Name: Tiffany Ho MRN: [id number] Date of Birth: 01-07-1981 Referring Provider:  Chevis Pretty, *  Encounter Date: 03/10/2015      PT End of Session - 03/10/15 1841    Visit Number 2   Number of Visits 16   Date for PT Re-Evaluation 03/25/15   Authorization Type BCBS   Authorization Time Period 02/23/15 - 04/25/15   Authorization - Visit Number 2   Authorization - Number of Visits 16   PT Start Time 3382   PT Stop Time 1835   PT Time Calculation (min) 50 min   Activity Tolerance Patient limited by pain;Patient tolerated treatment well   Behavior During Therapy [facility] for tasks assessed/performed;Anxious      Past Medical History  Diagnosis Date  . Gallstones   . Anxiety   . Carpal tunnel syndrome, bilateral   . Seasonal allergies   . Panic attacks   . Constipation     Past Surgical History  Procedure Laterality Date  . Cesarean section    . Cholecystectomy  07/29/2012    Procedure: LAPAROSCOPIC CHOLECYSTECTOMY WITH INTRAOPERATIVE CHOLANGIOGRAM;  Surgeon: Adin Hector, MD;  Location: [facility];  Service: General;  Laterality: N/A;  . Neck pain      There were no vitals filed for this visit.  Visit Diagnosis:  Pain in joint, shoulder region, left  Cervicalgia  Weakness of left upper extremity  Neck stiffness  Shoulder stiffness, left  Bad posture      Subjective Assessment - 03/10/15 1744    Subjective Pt reports compliance with HEP, stated less burning and more tightness Lt shoulder   Pertinent History Patient began having Lt neck and shoulder pain about 1 month ago. patient also injrued same shoulder 1 year ago while walking dogs, but pain improved miimally but never really improved. patient had x-ray/RMRI finding bulging Discs at C5-C6. 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 Patient had a shoulder injection for pain that did not increase. patient recently went to ER due to increased pain gfor which MD attributed to pain. history of anxiety.   Currently in Pain? 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   Onset Date 01/23/15   Next MD Visit Laroy Apple May 2016   Prior Therapy no           [facility] Adult PT Treatment/Exercise - 03/10/15 0001    Exercises   Exercises Neck   Neck Exercises: Standing   Other Standing Exercises Dowel pendelum 10x    Neck Exercises: Seated   Cervical Rotation Both;10 reps   Cervical Rotation Limitations 3D cervical excursion   Lateral Flexion Both;10 reps   Lateral Flexion Limitations 3D cervical excursion   Other Seated Exercise 3D thoracic excursin 10   Manual Therapy   Manual Therapy Massage   Massage Supine LE elevated focus on Upper and mid traps; suboccipital release and cervical traction; trial with position relase for Lt UT- unsuccessful   Neck Exercises: Stretches   Upper Trapezius Stretch 2  reps;30 seconds   Corner Stretch Limitations unilateral pec stretch at doorway   Other Neck Stretches anterior shoulder stretch 10x 3"               PT Short Term Goals - 03/10/15 1857    PT SHORT TERM GOAL #1   Title Patient will dmeonstrate thoracic spine extension of 20 degrees to improve upright posture   Status On-going   PT SHORT TERM GOAL #2   Title Patient will demonstrate increased cervical spine rotation to Lt to 75 degrees to more easily look over shoulder to look into blind spot while driving.    Status On-going   PT SHORT TERM GOAL #3   Title Patient will demonstrate decreased pain to 3/10 while sitting for <41minutes   Status On-going   PT SHORT TERM  GOAL #4   Title Patient will be independent with HEP.    Status On-going           PT Long Term Goals - 03/10/15 1857    PT LONG TERM GOAL #1   Title Patient will dmeosntrate Lt UE horizontal abduction of 30 degrees indicaing improved pectoral mobility to more easily reach behind self.    PT LONG TERM GOAL #2   Title Patient will demonstrate increased cervical spine rotation bilaterally to 80 degrees to more easily look over shoulders to look into blind spot while driving.    PT LONG TERM GOAL #3   Title Patient will demonstrate Lt shoulder strength grossly 5/5 to be able to return to regular exercise routine   PT LONG TERM GOAL #4   Title patient will be able to sleep 5 days a week through night waking only to use restroom   PT LONG TERM GOAL #5   Title patient will be able to sit >2 hours withotu pain to drive long distances.                Plan - 03/10/15 1842    Clinical Impression Statement Reviewed goals and HEP, pt.given copy of evaluation.  Session focus on instructing stretches for cervical and shoulder musculature and mobilty exercises.  Pt reported increased pain following exercises and stretches with reports of radicular sensations down Lt UE.  Ended session with manual therapy to cervical regin with large spasm Lt upper trap.  Cervical traction complete wtih reports of decreased tingling down Lt UE.  During mamual discussion held about stress level, pt stated lots of stressful events occuromg last 2 years.  Pt encouraged to speak to a therapist for stress relief.  Pt stated pain reduced to 3/10 at end of sessin.  Pt educated on benefits of hydration to reduce risk of headache following manual.     PT Next Visit Plan Initiate next session with soft tissue mobilization and thoracic spine joint mobilization.  Continue with upper trapezius stretch, Pectoral stretch,rhomboid, and anterior shoulder streches.  Dowel pendulems with functional manual reactions. below shoulder  height common shoulder dumbbell matrix, as pain improves progress to more functional strengthening.         Problem List Patient Active Problem List   Diagnosis Date Noted  . Hair loss 05/21/2014  . Other malaise and fatigue 05/21/2014  . Pain in joint, shoulder region 12/01/2013  . Cervicalgia 12/01/2013  . Gallstones 07/16/2012   Ihor Austin, [place]; [place] #[id number] [phone]  Aldona Lento 03/10/2015, 6:59 PM  [facility] [address] [place], [place], [address] Phone: [phone]  Fax:  [phone]

## 2015-03-17 ENCOUNTER — Ambulatory Visit (HOSPITAL_COMMUNITY): Payer: BLUE CROSS/BLUE SHIELD | Admitting: Physical Therapy

## 2015-03-23 ENCOUNTER — Ambulatory Visit (HOSPITAL_COMMUNITY): Payer: BLUE CROSS/BLUE SHIELD

## 2015-03-23 DIAGNOSIS — R293 Abnormal posture: Secondary | ICD-10-CM

## 2015-03-23 DIAGNOSIS — R29898 Other symptoms and signs involving the musculoskeletal system: Secondary | ICD-10-CM

## 2015-03-23 DIAGNOSIS — M542 Cervicalgia: Secondary | ICD-10-CM

## 2015-03-23 DIAGNOSIS — M25512 Pain in left shoulder: Secondary | ICD-10-CM

## 2015-03-23 DIAGNOSIS — M25612 Stiffness of left shoulder, not elsewhere classified: Secondary | ICD-10-CM

## 2015-03-23 DIAGNOSIS — M436 Torticollis: Secondary | ICD-10-CM

## 2015-03-23 NOTE — Therapy (Signed)
[place], [place], [address] Phone: [phone]   Fax:  [phone]  Physical Therapy Treatment  Patient Details  Name: Tiffany Ho MRN: [id number] Date of Birth: 04-22-1981 Referring Provider:  Chevis Pretty, *  Encounter Date: 03/23/2015      PT End of Session - 03/23/15 1744    Visit Number 3   Number of Visits 16   Date for PT Re-Evaluation 03/25/15   Authorization Type BCBS   Authorization Time Period 02/23/15 - 04/25/15   Authorization - Visit Number 3   Authorization - Number of Visits 16   PT Start Time 0109   PT Stop Time 1832   PT Time Calculation (min) 57 min   Activity Tolerance Patient tolerated treatment well   Behavior During Therapy [facility] for tasks assessed/performed      Past Medical History  Diagnosis Date  . Gallstones   . Anxiety   . Carpal tunnel syndrome, bilateral   . Seasonal allergies   . Panic attacks   . Constipation     Past Surgical History  Procedure Laterality Date  . Cesarean section    . Cholecystectomy  07/29/2012    Procedure: LAPAROSCOPIC CHOLECYSTECTOMY WITH INTRAOPERATIVE CHOLANGIOGRAM;  Surgeon: Adin Hector, MD;  Location: [facility];  Service: General;  Laterality: N/A;  . Neck pain      There were no vitals filed for this visit.  Visit Diagnosis:  Pain in joint, shoulder region, left  Weakness of left upper extremity  Neck stiffness  Shoulder stiffness, left  Bad posture  Cervicalgia      Subjective Assessment - 03/23/15 1739    Subjective Pt reported she continues to have burning and tightness Lt shoulder   Pertinent History Patient began having Lt neck and shoulder pain about 1 month ago. patient also injrued same shoulder 1 year ago while walking dogs, but pain improved miimally but never really improved. patient had x-ray/RMRI finding bulging Discs at C5-C6. 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 Patient had a shoulder injection for pain that did not increase. patient recently went to ER due to increased pain gfor which MD attributed to pain. history of anxiety.   Currently in Pain? 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 rediculitis   Onset Date 01/23/15   Next MD Visit Laroy Apple May 2016   Prior Therapy no             [facility] Adult PT Treatment/Exercise - 03/23/15 0001    Exercises   Exercises Neck   Neck Exercises: Standing   Other Standing Exercises Dowel pendelum 10x    Other Standing Exercises Below shoulder height common shoulder dumbbell matrix 2#   Neck Exercises: Seated   Cervical Rotation Both;10 reps   Cervical Rotation Limitations 3D cervical excursion   Lateral Flexion Both;10 reps   Lateral Flexion Limitations 3D cervical excursion   Other Seated Exercise 3D thoracic excursion 10   Manual Therapy   Manual Therapy Soft tissue mobilization   Soft tissue mobilization Supine LE elevated focus on Upper and mid traps; suboccipital release and cervical traction;   Neck Exercises: Stretches  Upper Trapezius Stretch 3 reps;30 seconds   Corner Stretch Limitations unilateral pec stretch at doorway   Other Neck Stretches anterior shoulder stretch 10x 3"   Other Neck Stretches Rhomboid 3x 30"              PT Short Term Goals - 03/23/15 1745    PT SHORT TERM GOAL #1   Title Patient will dmeonstrate thoracic spine extension of 20 degrees to improve upright posture   Status On-going   PT SHORT TERM GOAL #2   Title Patient will demonstrate increased cervical spine rotation to Lt to 75 degrees to more easily look over shoulder to look into blind spot while driving.    Status On-going   PT SHORT TERM GOAL #3   Title Patient will demonstrate decreased pain to 3/10 while  sitting for <50minutes   Status On-going   PT SHORT TERM GOAL #4   Title Patient will be independent with HEP.    Status On-going           PT Long Term Goals - 03/23/15 1745    PT LONG TERM GOAL #1   Title Patient will dmeosntrate Lt UE horizontal abduction of 30 degrees indicaing improved pectoral mobility to more easily reach behind self.    PT LONG TERM GOAL #2   Title Patient will demonstrate increased cervical spine rotation bilaterally to 80 degrees to more easily look over shoulders to look into blind spot while driving.    PT LONG TERM GOAL #3   Title Patient will demonstrate Lt shoulder strength grossly 5/5 to be able to return to regular exercise routine   PT LONG TERM GOAL #4   Title patient will be able to sleep 5 days a week through night waking only to use restroom   PT LONG TERM GOAL #5   Title patient will be able to sit >2 hours withotu pain to drive long distances.                Plan - 03/23/15 1806    Clinical Impression Statement Continued session with initial cervical and thoracic mobility exercises and stretches with min cueing required for form and technique.  Added below shoulder height common shoulder dumbbell matrix for shoulder strengthening with noted musculature fatigue with 10 reps.  Manual session focus on upper traps, pecs and posterior cervical region, able to reduce though not fully resolve large spasm Lt upper trap.  Pt reported no burning sensations following manual and pain reduced to 1-2/10.  Pt encouraged to drink extra water to reduce risk of headaches following manual.     PT Next Visit Plan Initiate next session with soft tissue mobilization and thoracic spine joint mobilization.  Continue with upper trapezius stretch, Pectoral stretch,rhomboid, and anterior shoulder streches.  Dowel pendulems with functional manual reactions. below shoulder height common shoulder dumbbell matrix, as pain improves progress to more functional strengthening.          Problem List Patient Active Problem List   Diagnosis Date Noted  . Hair loss 05/21/2014  . Other malaise and fatigue 05/21/2014  . Pain in joint, shoulder region 12/01/2013  . Cervicalgia 12/01/2013  . Gallstones 07/16/2012   Ihor Austin, [place]; [place] #[id number] [phone]  Aldona Lento 03/23/2015, 6:39 PM  [place] [address] [place], [place], [address] Phone: [phone]   Fax:  [phone]

## 2015-03-25 ENCOUNTER — Ambulatory Visit (HOSPITAL_COMMUNITY): Payer: BLUE CROSS/BLUE SHIELD | Admitting: Physical Therapy

## 2015-03-25 DIAGNOSIS — M25612 Stiffness of left shoulder, not elsewhere classified: Secondary | ICD-10-CM

## 2015-03-25 DIAGNOSIS — M542 Cervicalgia: Secondary | ICD-10-CM

## 2015-03-25 DIAGNOSIS — M25512 Pain in left shoulder: Secondary | ICD-10-CM

## 2015-03-25 DIAGNOSIS — R29898 Other symptoms and signs involving the musculoskeletal system: Secondary | ICD-10-CM

## 2015-03-25 DIAGNOSIS — M436 Torticollis: Secondary | ICD-10-CM

## 2015-03-25 DIAGNOSIS — R293 Abnormal posture: Secondary | ICD-10-CM

## 2015-03-25 NOTE — Therapy (Signed)
[place], [place], [address] Phone: [phone]   Fax:  [phone]  Physical Therapy Treatment  Patient Details  Name: Tiffany Ho MRN: [id number] Date of Birth: [birth year] Referring Provider:  Laroy Apple, MD  Encounter Date: 03/25/2015      PT End of Session - 03/25/15 1857    Visit Number 4   Number of Visits 16   Date for PT Re-Evaluation 04/03/15   Authorization Type BCBS   Authorization Time Period 02/23/15 - 04/25/15   Authorization - Visit Number 4   Authorization - Number of Visits 16   PT Start Time 8841   PT Stop Time 1831   PT Time Calculation (min) 46 min   Activity Tolerance Patient tolerated treatment well   Behavior During Therapy [facility] for tasks assessed/performed      Past Medical History  Diagnosis Date  . Gallstones   . Anxiety   . Carpal tunnel syndrome, bilateral   . Seasonal allergies   . Panic attacks   . Constipation     Past Surgical History  Procedure Laterality Date  . Cesarean section    . Cholecystectomy  07/29/2012    Procedure: LAPAROSCOPIC CHOLECYSTECTOMY WITH INTRAOPERATIVE CHOLANGIOGRAM;  Surgeon: Adin Hector, MD;  Location: [facility];  Service: General;  Laterality: N/A;  . Neck pain      There were no vitals filed for this visit.  Visit Diagnosis:  Pain in joint, shoulder region, left  Weakness of left upper extremity  Neck stiffness  Shoulder stiffness, left  Bad posture  Cervicalgia      Subjective Assessment - 03/25/15 1851    Subjective Pt reported she continues to have burning and tightness anterior Lt shoulder and just medial to shoulder blade. Pain reported at 5/10 with patient stating she was very stressed on her way to therapy as she was late by 57minutes             [facility] Adult PT Treatment/Exercise - 03/25/15 0001    Neck Exercises: Theraband   Rows Blue;10 reps   Rows Limitations 2 sets shoulder height and waist  height.    Neck Exercises: Seated   Cervical Rotation Both;10 reps   Cervical Rotation Limitations 3D cervical excursion   Lateral Flexion Both;10 reps   Lateral Flexion Limitations 3D cervical excursion   Other Seated Exercise 3D thoracic excursion 10   Shoulder Exercises: Stretch   Other Shoulder Stretches 3way pectoral stretch 2x 20 seconds each    Manual Therapy   Manual therapy comments grade 2 Lt and Rt cervical up glides and 1st rib down glides.    Soft tissue mobilization Supine LE elevated focus on Upper and mid traps; suboccipital release and cervical traction;and Lt pectoral muscle   Neck Exercises: Stretches   Upper Trapezius Stretch 3 reps;30 seconds   Other Neck Stretches anterior shoulder stretch 10x 3"   Other Neck Stretches Rhomboid 3x 30"                  PT Short Term Goals - 03/23/15 1745    PT SHORT TERM GOAL #1   Title Patient will dmeonstrate thoracic spine extension of 20 degrees to improve upright posture   Status On-going   PT SHORT TERM GOAL #2   Title Patient will demonstrate increased cervical spine rotation to Lt to 75 degrees to more easily look over shoulder to look into blind spot while driving.    Status  On-going   PT SHORT TERM GOAL #3   Title Patient will demonstrate decreased pain to 3/10 while sitting for <75minutes   Status On-going   PT SHORT TERM GOAL #4   Title Patient will be independent with HEP.    Status On-going           PT Long Term Goals - 03/23/15 1745    PT LONG TERM GOAL #1   Title Patient will dmeosntrate Lt UE horizontal abduction of 30 degrees indicaing improved pectoral mobility to more easily reach behind self.    PT LONG TERM GOAL #2   Title Patient will demonstrate increased cervical spine rotation bilaterally to 80 degrees to more easily look over shoulders to look into blind spot while driving.    PT LONG TERM GOAL #3   Title Patient will demonstrate Lt shoulder strength grossly 5/5 to be able to  return to regular exercise routine   PT LONG TERM GOAL #4   Title patient will be able to sleep 5 days a week through night waking only to use restroom   PT LONG TERM GOAL #5   Title patient will be able to sit >2 hours withotu pain to drive long distances.                Plan - 03/25/15 1857    Clinical Impression Statement Continued session with initial cervical and thoracic mobility exercises and stretches with min cueing required for form and technique. With patient dispalying limited shoulder horizontal abduction and verbal complain of anterior shoulder tightness with numbness and tingling in lt UE with horizontal abduction manual focused most on increased pectoral muscle mobility. Patient displasyed increased ability to rotate and reach behind selff following manual therapy and noted decreased pain. No complaint of head aches today. Added rows this session to increase posterior shoulder stabilization.    PT Next Visit Plan Initiate next session with soft tissue mobilization and thoracic spine joint mobilization.  Continue with upper trapezius stretch, Pectoral stretch,rhomboid, and anterior shoulder streches.  Dowel pendulems with functional manual reactions. below shoulder height common shoulder dumbbell matrix, continue rows and  introduce overhead dumbbell matrix if patient is in low pain state.         Problem List Patient Active Problem List   Diagnosis Date Noted  . Hair loss 05/21/2014  . Other malaise and fatigue 05/21/2014  . Pain in joint, shoulder region 12/01/2013  . Cervicalgia 12/01/2013  . Gallstones 07/16/2012   Devona Konig PT DPT [place], [place], [address] Phone: [phone]   Fax:  [phone]

## 2015-03-30 ENCOUNTER — Ambulatory Visit (HOSPITAL_COMMUNITY): Payer: BLUE CROSS/BLUE SHIELD

## 2015-03-30 DIAGNOSIS — R293 Abnormal posture: Secondary | ICD-10-CM

## 2015-03-30 DIAGNOSIS — M25612 Stiffness of left shoulder, not elsewhere classified: Secondary | ICD-10-CM

## 2015-03-30 DIAGNOSIS — M25512 Pain in left shoulder: Secondary | ICD-10-CM | POA: Diagnosis not present

## 2015-03-30 DIAGNOSIS — M436 Torticollis: Secondary | ICD-10-CM

## 2015-03-30 DIAGNOSIS — M542 Cervicalgia: Secondary | ICD-10-CM

## 2015-03-30 DIAGNOSIS — R29898 Other symptoms and signs involving the musculoskeletal system: Secondary | ICD-10-CM

## 2015-03-30 NOTE — Therapy (Signed)
[place], [place], [address] Phone: [phone]   Fax:  [phone]  Physical Therapy Treatment  Patient Details  Name: Tiffany Ho MRN: [id number] Date of Birth: 23-Sep-1981 Referring Provider:  Chevis Pretty, *  Encounter Date: 03/30/2015      PT End of Session - 03/30/15 1757    Visit Number 5   Number of Visits 16   Date for PT Re-Evaluation 04/03/15   Authorization Type BCBS   Authorization Time Period 02/23/15 - 04/25/15   Authorization - Visit Number 5   Authorization - Number of Visits 16   PT Start Time 5427   PT Stop Time 0623   PT Time Calculation (min) 55 min   Activity Tolerance Patient tolerated treatment well   Behavior During Therapy [facility] for tasks assessed/performed      Past Medical History  Diagnosis Date  . Gallstones   . Anxiety   . Carpal tunnel syndrome, bilateral   . Seasonal allergies   . Panic attacks   . Constipation     Past Surgical History  Procedure Laterality Date  . Cesarean section    . Cholecystectomy  07/29/2012    Procedure: LAPAROSCOPIC CHOLECYSTECTOMY WITH INTRAOPERATIVE CHOLANGIOGRAM;  Surgeon: Adin Hector, MD;  Location: [facility];  Service: General;  Laterality: N/A;  . Neck pain      There were no vitals filed for this visit.  Visit Diagnosis:  Pain in joint, shoulder region, left  Weakness of left upper extremity  Neck stiffness  Shoulder stiffness, left  Bad posture  Cervicalgia      Subjective Assessment - 03/30/15 1741    Subjective Pt reported decreased burning followng last session in Lt shoulder, stated no real pain today just has throbbing pain in elbow and shoulder joints.     Currently in Pain? No/denies                 [facility] Adult PT Treatment/Exercise - 03/30/15 0001    Exercises   Exercises Neck   Neck Exercises: Theraband   Scapula Retraction 15 reps;Blue   Rows 15 reps;Blue   Neck Exercises:  Standing   Other Standing Exercises Dowel pendelum 10x    Other Standing Exercises UE dumbbell matrix with 3#   Neck Exercises: Seated   Cervical Rotation Both;10 reps   Cervical Rotation Limitations 3D cervical excursion   Lateral Flexion Both;10 reps   Lateral Flexion Limitations 3D cervical excursion   Other Seated Exercise 3D thoracic excursion 10   Shoulder Exercises: Stretch   Other Shoulder Stretches 3way pectoral stretch 2x 20 seconds each    Manual Therapy   Manual Therapy Soft tissue mobilization   Manual therapy comments grade 2 Lt and Rt cervical up glides and 1st rib down glides. Grade I and II Lt 1st rib   Soft tissue mobilization Supine LE elevated focus on Lt>Rt UT, scalenes, pect   Neck Exercises: Stretches   Upper Trapezius Stretch 3 reps;30 seconds   Other Neck Stretches anterior shoulder stretch 10x 3"   Other Neck Stretches Rhomboid 3x 30"                  PT Short Term Goals - 03/30/15 1819    PT SHORT TERM GOAL #1   Title Patient will dmeonstrate thoracic spine extension of 20 degrees to improve upright posture   Status On-going   PT SHORT TERM GOAL #2   Title Patient will demonstrate  increased cervical spine rotation to Lt to 75 degrees to more easily look over shoulder to look into blind spot while driving.    Status On-going   PT SHORT TERM GOAL #3   Title Patient will demonstrate decreased pain to 3/10 while sitting for <55minutes   Status On-going   PT SHORT TERM GOAL #4   Title Patient will be independent with HEP.    Status On-going           PT Long Term Goals - 03/30/15 1820    PT LONG TERM GOAL #1   Title Patient will dmeosntrate Lt UE horizontal abduction of 30 degrees indicaing improved pectoral mobility to more easily reach behind self.    PT LONG TERM GOAL #2   Title Patient will demonstrate increased cervical spine rotation bilaterally to 80 degrees to more easily look over shoulders to look into blind spot while driving.     PT LONG TERM GOAL #3   Title Patient will demonstrate Lt shoulder strength grossly 5/5 to be able to return to regular exercise routine   Status On-going   PT LONG TERM GOAL #4   Title patient will be able to sleep 5 days a week through night waking only to use restroom   PT LONG TERM GOAL #5   Title patient will be able to sit >2 hours withotu pain to drive long distances.                Plan - 03/30/15 1821    Clinical Impression Statement Pt making great gains in cervical and thoracic mobiilty, continued initial session with excursion exercises and stretches.  Continued postural strengthening and began overhead dumbbell matrix for shoulder strenghtening with noted instabilty.  Manual soft tissue techniques complete to reduce spasms on Lt UT, scalenes, pec major and minor and posterior cervical region.  PT completed manual joint mobs with reports of increased pain with Lt C4-C5 down glides, reports of relief with up glides inconsistenctly.  Following manual, pt. consistently displays Lt UE radiating pain that are inconsistent with UE and cervical movements.     PT Next Visit Plan Next session plan to increase cervical and thoracic stability with below shoulder height common shoulder dumbbell, matrix, rows, continue stretches for pectoral, UT, scalenes and anterior shoulder capsule, Dowel pendelum for Lt shoulder ROM.  Manual focus on 1 rib grade I and II, scalenes, pects.  Asses need for medial, radiial and ulnar nerve stretches.        Problem List Patient Active Problem List   Diagnosis Date Noted  . Hair loss 05/21/2014  . Other malaise and fatigue 05/21/2014  . Pain in joint, shoulder region 12/01/2013  . Cervicalgia 12/01/2013  . Gallstones 07/16/2012   Ihor Austin, [place]; [place] #[id number] [phone]  Aldona Lento 03/30/2015, 6:52 PM  [place] [address] [place], [place], [address] Phone: [phone]   Fax:   [phone]

## 2015-04-01 ENCOUNTER — Ambulatory Visit (HOSPITAL_COMMUNITY): Payer: BLUE CROSS/BLUE SHIELD | Admitting: Physical Therapy

## 2015-04-01 DIAGNOSIS — M25512 Pain in left shoulder: Secondary | ICD-10-CM | POA: Diagnosis not present

## 2015-04-01 DIAGNOSIS — R29898 Other symptoms and signs involving the musculoskeletal system: Secondary | ICD-10-CM

## 2015-04-01 DIAGNOSIS — M542 Cervicalgia: Secondary | ICD-10-CM

## 2015-04-01 DIAGNOSIS — M25612 Stiffness of left shoulder, not elsewhere classified: Secondary | ICD-10-CM

## 2015-04-01 DIAGNOSIS — R293 Abnormal posture: Secondary | ICD-10-CM

## 2015-04-01 DIAGNOSIS — M436 Torticollis: Secondary | ICD-10-CM

## 2015-04-01 NOTE — Therapy (Signed)
[place], [place], [address] Phone: [phone]   Fax:  [phone]  Physical Therapy Treatment  Patient Details  Name: Tiffany Ho MRN: [id number] Date of Birth: 1981-05-05 Referring Provider:  Chevis Pretty, *  Encounter Date: 04/01/2015      PT End of Session - 04/01/15 2026    Visit Number 6   Number of Visits 16   Date for PT Re-Evaluation 04/06/15   Authorization Type BCBS   Authorization Time Period 02/23/15 - 04/25/15   Authorization - Visit Number 6   Authorization - Number of Visits 16   PT Start Time 8850   PT Stop Time 1815   PT Time Calculation (min) 40 min   Activity Tolerance Patient tolerated treatment well   Behavior During Therapy [facility] for tasks assessed/performed      Past Medical History  Diagnosis Date  . Gallstones   . Anxiety   . Carpal tunnel syndrome, bilateral   . Seasonal allergies   . Panic attacks   . Constipation     Past Surgical History  Procedure Laterality Date  . Cesarean section    . Cholecystectomy  07/29/2012    Procedure: LAPAROSCOPIC CHOLECYSTECTOMY WITH INTRAOPERATIVE CHOLANGIOGRAM;  Surgeon: Adin Hector, MD;  Location: [place];  Service: General;  Laterality: N/A;  . Neck pain      There were no vitals filed for this visit.  Visit Diagnosis:  Pain in joint, shoulder region, left  Weakness of left upper extremity  Shoulder stiffness, left  Bad posture  Neck stiffness  Cervicalgia      Subjective Assessment - 04/01/15 2013    Subjective Patient notes continued pain in Lt upper trapezius area   Currently in Pain? Yes   Pain Score 3    Pain Location Arm   Pain Orientation Left                         [facility] Adult PT Treatment/Exercise - 04/01/15 0001    Neck Exercises: Theraband   Rows Blue   Rows Limitations 3 sets: bilatral, 1 unilateral each    Neck Exercises: Standing   Other Standing Exercises Cervical  spine matrix 10x with PVC pipe.    Other Standing Exercises UE dumbbell matrix below shoulder height with 3#   Neck Exercises: Seated   Cervical Rotation Both;10 reps   Cervical Rotation Limitations 3D cervical excursion   Lateral Flexion Both;10 reps   Lateral Flexion Limitations 3D cervical excursion   Other Seated Exercise 3D thoracic excursion 10                PT Education - 04/01/15 2020    Education Details Patient educated on improtance of recognition of painful movmenet patterns and identifying painful patterns vs over focusing on end range pain as end range pain is common with over stretching for which patient was educated on preventing.    Person(s) Educated Patient   Methods Explanation;Demonstration   Comprehension Verbalized understanding;Returned demonstration          PT Short Term Goals - 03/30/15 1819    PT SHORT TERM GOAL #1   Title Patient will dmeonstrate thoracic spine extension of 20 degrees to improve upright posture   Status On-going   PT SHORT TERM GOAL #2   Title Patient will demonstrate increased cervical spine rotation to Lt to 75 degrees to more easily look over shoulder to look into  blind spot while driving.    Status On-going   PT SHORT TERM GOAL #3   Title Patient will demonstrate decreased pain to 3/10 while sitting for <42minutes   Status On-going   PT SHORT TERM GOAL #4   Title Patient will be independent with HEP.    Status On-going           PT Long Term Goals - 03/30/15 1820    PT LONG TERM GOAL #1   Title Patient will dmeosntrate Lt UE horizontal abduction of 30 degrees indicaing improved pectoral mobility to more easily reach behind self.    PT LONG TERM GOAL #2   Title Patient will demonstrate increased cervical spine rotation bilaterally to 80 degrees to more easily look over shoulders to look into blind spot while driving.    PT LONG TERM GOAL #3   Title Patient will demonstrate Lt shoulder strength grossly 5/5 to be  able to return to regular exercise routine   Status On-going   PT LONG TERM GOAL #4   Title patient will be able to sleep 5 days a week through night waking only to use restroom   PT LONG TERM GOAL #5   Title patient will be able to sit >2 hours withotu pain to drive long distances.                Plan - 04/01/15 2026    Clinical Impression Statement thouugh patient arrived with conitnued burning pain in Lt upper extremity and upper trapezius, patint had minimla increase in pain throughtou session with all overhead and shoulder ahorizontal abducton positions avoided. Patient was however to go into painful positions during cervical spine matrix noting no increase in pain  when focus was given to the task at hand with patient noting throughout the performance of the cervical spine matrix that she had no pain. manual performed at end of session for improvement of fascial restrictions with minimal restrictions noted;this is likely due to patient taking muslce relaxants of the last 48 hours.    PT Next Visit Plan Continue with below shoulder height shoulder dumbbell, matrix, rows, continue stretches for pectoral, UT, scalenes and anterior shoulder capsule (10x 3 second holds), Cervical spine matrix.  Manual focus on 1 rib grade I and II, scalenes, pects.  Asses need for medial, radiial and ulnar nerve stretches.        Problem List Patient Active Problem List   Diagnosis Date Noted  . Hair loss 05/21/2014  . Other malaise and fatigue 05/21/2014  . Pain in joint, shoulder region 12/01/2013  . Cervicalgia 12/01/2013  . Gallstones 07/16/2012    Devona Konig PT DPT [place], [place], [address] Phone: [phone]   Fax:  [phone]

## 2015-04-06 ENCOUNTER — Ambulatory Visit (HOSPITAL_COMMUNITY): Payer: BLUE CROSS/BLUE SHIELD

## 2015-04-06 DIAGNOSIS — M436 Torticollis: Secondary | ICD-10-CM

## 2015-04-06 DIAGNOSIS — R293 Abnormal posture: Secondary | ICD-10-CM

## 2015-04-06 DIAGNOSIS — M25512 Pain in left shoulder: Secondary | ICD-10-CM | POA: Diagnosis not present

## 2015-04-06 DIAGNOSIS — M25612 Stiffness of left shoulder, not elsewhere classified: Secondary | ICD-10-CM

## 2015-04-06 DIAGNOSIS — R29898 Other symptoms and signs involving the musculoskeletal system: Secondary | ICD-10-CM

## 2015-04-06 NOTE — Therapy (Signed)
[place], [place], [address] Phone: [phone]   Fax:  [phone]  Physical Therapy Treatment  Patient Details  Name: JOSELYN EDLING MRN: [id number] Date of Birth: [birth year] Referring Provider:  Chevis Pretty, *  Encounter Date: 04/06/2015      PT End of Session - 04/06/15 1838    Visit Number 7   Number of Visits 16   Date for PT Re-Evaluation 04/25/15   Authorization Type BCBS   Authorization Time Period 02/23/15 - 04/25/15   Authorization - Visit Number 7   Authorization - Number of Visits 16   PT Start Time 4097   PT Stop Time 1840   PT Time Calculation (min) 55 min   Activity Tolerance Patient tolerated treatment well   Behavior During Therapy [facility] for tasks assessed/performed      Past Medical History  Diagnosis Date  . Gallstones   . Anxiety   . Carpal tunnel syndrome, bilateral   . Seasonal allergies   . Panic attacks   . Constipation     Past Surgical History  Procedure Laterality Date  . Cesarean section    . Cholecystectomy  07/29/2012    Procedure: LAPAROSCOPIC CHOLECYSTECTOMY WITH INTRAOPERATIVE CHOLANGIOGRAM;  Surgeon: Adin [place], MD;  Location: [place];  Service: General;  Laterality: N/A;  . Neck pain      There were no vitals filed for this visit.  Visit Diagnosis:  Pain in joint, shoulder region, left  Weakness of left upper extremity  Shoulder stiffness, left  Bad posture  Neck stiffness      Subjective Assessment - 04/06/15 1745    Subjective Pt stated pain scale 3/10 feels more in the neck, stated decreased burning Lt UE.     How long can you sit comfortably? Still unable to sit still without moving   How long can you stand comfortably? Increased ability to stand still unable to rate time (was unable to stand still at all)   How long can you walk comfortably? No difficulty.     Currently in Pain? Yes   Pain Score 3    Pain Location Neck   Pain  Orientation Posterior   Pain Descriptors / Indicators Discomfort            [facility] PT Assessment - 04/06/15 0001    Assessment   Medical Diagnosis C5-6 HNP and C6 rediculitis   Onset Date/Surgical Date 01/23/15   Next MD Visit Laroy Apple 04/22/2015   Prior Therapy no   Observation/Other Assessments   Focus on Therapeutic Outcomes (FOTO)  44% limited (was 56% limited)   ROM / Strength   AROM / PROM / Strength AROM;Strength   AROM   Overall AROM Comments Lumbar spine: ROM WNL   AROM Assessment Site Shoulder;Cervical;Thoracic   Right/Left Shoulder Right;Left   Right Shoulder Flexion 180 Degrees   Right Shoulder ABduction 180 Degrees   Right Shoulder Horizontal ABduction 42 Degrees  was 30   Left Shoulder Flexion 180 Degrees   Left Shoulder ABduction 180 Degrees   Left Shoulder Horizontal ABduction 26 Degrees  was 20   Cervical Flexion 70  was 70   Cervical Extension 65  was 65   Cervical - Right Side Bend 45  was 45   Cervical - Left Side Bend 45  was 45   Cervical - Right Rotation 70  was 70   Cervical - Left Rotation 62  was 62   Thoracic Flexion  80  was 80   Thoracic Extension --  was 3   Thoracic - Right Side Bend --  was 0   Thoracic - Right Rotation --  was 60   Thoracic - Left Rotation --  was 60   Strength   Strength Assessment Site Shoulder   Right/Left Shoulder Right;Left   Right Shoulder Flexion 5/5   Right Shoulder ABduction 5/5   Left Shoulder Flexion 4+/5  was 4/5   Left Shoulder ABduction 4+/5  was 4/5            [facility] Adult PT Treatment/Exercise - 04/06/15 0001    Exercises   Exercises Neck   Neck Exercises: Theraband   Rows Blue   Rows Limitations 3 sets: bilatral, 1 unilateral each    Neck Exercises: Standing   Neck Retraction 5 reps;5 secs   Neck Retraction Limitations therapist facilitation with max tactile cueing   Other Standing Exercises UE dumbbell matrix below shoulder height with 3#   Neck Exercises: Seated    Cervical Rotation Both;10 reps   Cervical Rotation Limitations 3D cervical excursion   Lateral Flexion Both;10 reps   Lateral Flexion Limitations 3D cervical excursion   Other Seated Exercise 3D thoracic excursion 10   Manual Therapy   Manual Therapy Soft tissue mobilization   Soft tissue mobilization Supine LE elevated focus on Lt>Rt UT, scalenes, pect, paraspinals                  PT Short Term Goals - 04/06/15 1749    PT SHORT TERM GOAL #1   Title Patient will dmeonstrate thoracic spine extension of 20 degrees to improve upright posture   Status On-going   PT SHORT TERM GOAL #2   Title Patient will demonstrate increased cervical spine rotation to Lt to 75 degrees to more easily look over shoulder to look into blind spot while driving.    Status On-going   PT SHORT TERM GOAL #3   Title Patient will demonstrate decreased pain to 3/10 while sitting for <15mnutes   Baseline Unable to sit comfortably, unable to rate time   Status Not Met   PT SHORT TERM GOAL #4   Title Patient will be independent with HEP.    Baseline 04/06/2015: pt stated too busy with children to complete HEP    Status Not Met           PT Long Term Goals - 04/06/15 1754    PT LONG TERM GOAL #1   Title Patient will dmeosntrate Lt UE horizontal abduction of 30 degrees indicaing improved pectoral mobility to more easily reach behind self.    Baseline 04/06/2015 AROM 26 degrees Lt UE horizontal abduction   PT LONG TERM GOAL #2   Title Patient will demonstrate increased cervical spine rotation bilaterally to 80 degrees to more easily look over shoulders to look into blind spot while driving.    Status Not Met   PT LONG TERM GOAL #3   Title Patient will demonstrate Lt shoulder strength grossly 5/5 to be able to return to regular exercise routine   Status On-going   PT LONG TERM GOAL #4   Title patient will be able to sleep 5 days a week through night waking only to use restroom   Baseline 04/06/2015:  Toss and turn due to increased neck pain   PT LONG TERM GOAL #5   Title patient will be able to sit >2 hours withotu pain to drive long distances.  Status Not Met               Plan - 04/06/15 1839    Clinical Impression Statement Reassessmetn complete with the following findings:  Pt reports too busy with her 3 sons and work to complete HEP consistently, does state she continues to work on cervical mobility throughout the day.  Pt reports she still is unable to sit or stand for any amount of time comfortably.  Overall cervical and UE ROM is improving and UE strength is progressing well.  Pt has increased difficulty with proper posture with max multimodal cueing required with cervical retraction.  Session continued with cervical and thoracic mobilty and Lt UE strengthening.  Ended session with manual to reduce spasms iBil Upper traps, scalenes, pecs; unable to release fully but able to reduce UT spasms.  Noted significant myofascial release on paraspinals around C7-T1.  Pt encouraged to stay hydrated to reduce risk of headaches following manual.     PT Next Visit Plan Recommend continuing OPPT for 2x 4 more weeks to address goals unmet.  Continue with below shoulder height shoulder dumbbell, matrix, rows, continue stretches for pectoral, UT, scalenes and anterior shoulder capsule (10x 3 second holds), Cervical spine matrix.  Manual focus on 1 rib grade I and II, scalenes, pects.  Next session include cervical retraciton against wall or prone        Problem List Patient Active Problem List   Diagnosis Date Noted  . Hair loss 05/21/2014  . Other malaise and fatigue 05/21/2014  . Pain in joint, shoulder region 12/01/2013  . Cervicalgia 12/01/2013  . Gallstones 07/16/2012   Aldona Lento, PTA  Aldona Lento 04/06/2015, 6:50 PM  [facility] [address] [place], [place], [address] Phone: [phone]   Fax:   [phone]

## 2015-04-08 ENCOUNTER — Ambulatory Visit (HOSPITAL_COMMUNITY): Payer: BLUE CROSS/BLUE SHIELD | Attending: Physical Medicine and Rehabilitation

## 2015-04-08 DIAGNOSIS — R293 Abnormal posture: Secondary | ICD-10-CM | POA: Insufficient documentation

## 2015-04-08 DIAGNOSIS — M542 Cervicalgia: Secondary | ICD-10-CM | POA: Diagnosis not present

## 2015-04-08 DIAGNOSIS — M25612 Stiffness of left shoulder, not elsewhere classified: Secondary | ICD-10-CM

## 2015-04-08 DIAGNOSIS — M25512 Pain in left shoulder: Secondary | ICD-10-CM | POA: Insufficient documentation

## 2015-04-08 DIAGNOSIS — R29898 Other symptoms and signs involving the musculoskeletal system: Secondary | ICD-10-CM | POA: Diagnosis not present

## 2015-04-08 DIAGNOSIS — M436 Torticollis: Secondary | ICD-10-CM | POA: Insufficient documentation

## 2015-04-08 NOTE — Therapy (Signed)
[place], [place], [address] Phone: [phone]   Fax:  [phone]  Physical Therapy Treatment  Patient Details  Name: Tiffany Ho MRN: [id number] Date of Birth: 26-Jul-1981 Referring Provider:  Chevis Pretty, *  Encounter Date: 04/08/2015      PT End of Session - 04/08/15 1750    Visit Number 8   Number of Visits 16   Date for PT Re-Evaluation 04/25/15   Authorization Type BCBS   Authorization Time Period 02/23/15 - 04/25/15   Authorization - Visit Number 8   Authorization - Number of Visits 16   PT Start Time 7867   PT Stop Time 1830   PT Time Calculation (min) 48 min   Activity Tolerance Patient tolerated treatment well   Behavior During Therapy [facility] for tasks assessed/performed      Past Medical History  Diagnosis Date  . Gallstones   . Anxiety   . Carpal tunnel syndrome, bilateral   . Seasonal allergies   . Panic attacks   . Constipation     Past Surgical History  Procedure Laterality Date  . Cesarean section    . Cholecystectomy  07/29/2012    Procedure: LAPAROSCOPIC CHOLECYSTECTOMY WITH INTRAOPERATIVE CHOLANGIOGRAM;  Surgeon: Adin [place], MD;  Location: [place];  Service: General;  Laterality: N/A;  . Neck pain      There were no vitals filed for this visit.  Visit Diagnosis:  Pain in joint, shoulder region, left  Weakness of left upper extremity  Shoulder stiffness, left  Bad posture  Neck stiffness  Cervicalgia      Subjective Assessment - 04/08/15 1747    Subjective Pt stated stressful day with work with reports of burning on posterior neck   Currently in Pain? Yes   Pain Score 5    Pain Location Neck   Pain Orientation Posterior   Pain Descriptors / Indicators Burning             [facility] Adult PT Treatment/Exercise - 04/08/15 0001    Exercises   Exercises Neck   Neck Exercises: Theraband   Scapula Retraction 15 reps;Blue   Shoulder Extension 15  reps;Blue   Rows 15 reps;Blue   Neck Exercises: Standing   Neck Retraction 10 reps;5 secs   Neck Retraction Limitations therapist facilitation with max tactile cueing   Other Standing Exercises UE dumbbell matrix below shoulder height with 3#   Neck Exercises: Seated   Cervical Rotation Both;10 reps   Cervical Rotation Limitations 3D cervical excursion   Lateral Flexion Both;10 reps   Lateral Flexion Limitations 3D cervical excursion   Other Seated Exercise 3D thoracic excursion 10   Neck Exercises: Prone   Neck Retraction 10 reps;5 secs   Neck Retraction Limitations chin tucks    Manual Therapy   Manual Therapy Soft tissue mobilization   Soft tissue mobilization Prone upper and mid traps, cervical and thoracic region paraspinals   Neck Exercises: Stretches   Upper Trapezius Stretch 3 reps;30 seconds   Corner Stretch Limitations unilateral pec stretch at doorway             PT Short Term Goals - 04/08/15 1750    PT SHORT TERM GOAL #1   Title Patient will dmeonstrate thoracic spine extension of 20 degrees to improve upright posture   Status On-going   PT SHORT TERM GOAL #2   Title Patient will demonstrate increased cervical spine rotation to Lt to 75 degrees to more  easily look over shoulder to look into blind spot while driving.    Status On-going   PT SHORT TERM GOAL #3   Title Patient will demonstrate decreased pain to 3/10 while sitting for <31minutes   Status On-going   PT SHORT TERM GOAL #4   Title Patient will be independent with HEP.    Status On-going           PT Long Term Goals - 04/08/15 1756    PT LONG TERM GOAL #1   Title Patient will dmeosntrate Lt UE horizontal abduction of 30 degrees indicaing improved pectoral mobility to more easily reach behind self.    PT LONG TERM GOAL #2   Title Patient will demonstrate increased cervical spine rotation bilaterally to 80 degrees to more easily look over shoulders to look into blind spot while driving.    PT  LONG TERM GOAL #3   Title Patient will demonstrate Lt shoulder strength grossly 5/5 to be able to return to regular exercise routine   PT LONG TERM GOAL #4   Title patient will be able to sleep 5 days a week through night waking only to use restroom   PT LONG TERM GOAL #5   Title patient will be able to sit >2 hours withotu pain to drive long distances.                Plan - 04/08/15 1816    Clinical Impression Statement Began prone chin tucks to improve mechancis with cervical retraction, pt able to demonstrate improved mechanics in standing position with less cueing required.  Progressed postural strengthening with blue theraband and standing exercises and increased weight with shoulder height dumbbell matrix.  Manual techniques complete to reduce spasms Bil upper and mid traps and overall tightness, able to reduce but unable to fully resolve.  End of session pt reported pain resolved posterior neck just feels "pulling".  Pt educatied on proper posture landmarks and encouraged to drink extra water to reduce risk of headaches.     PT Next Visit Plan Continue with below shoulder height shoulder dumbbell, matrix, rows, continue stretches for pectoral, UT, scalenes and anterior shoulder capsule (10x 3 second holds), Cervical spine matrix.  Manual focus on 1 rib grade I and II, scalenes, pects.  Continue posture education and strengthening.        Problem List Patient Active Problem List   Diagnosis Date Noted  . Hair loss 05/21/2014  . Other malaise and fatigue 05/21/2014  . Pain in joint, shoulder region 12/01/2013  . Cervicalgia 12/01/2013  . Gallstones 07/16/2012   Aldona Lento, PTA  Aldona Lento 04/08/2015, 6:36 PM  [facility] [address] [place], [place], [address] Phone: [phone]   Fax:  [phone]

## 2015-04-16 ENCOUNTER — Other Ambulatory Visit: Payer: Self-pay | Admitting: Obstetrics and Gynecology

## 2015-04-16 DIAGNOSIS — Z978 Presence of other specified devices: Secondary | ICD-10-CM

## 2015-04-16 DIAGNOSIS — Z960 Presence of urogenital implants: Principal | ICD-10-CM

## 2015-04-19 ENCOUNTER — Ambulatory Visit
Admission: RE | Admit: 2015-04-19 | Discharge: 2015-04-19 | Disposition: A | Discharge status: Home / Self Care | Payer: BLUE CROSS/BLUE SHIELD | Source: Ambulatory Visit | Attending: Obstetrics and Gynecology | Admitting: Obstetrics and Gynecology

## 2015-04-19 DIAGNOSIS — Z960 Presence of urogenital implants: Principal | ICD-10-CM

## 2015-04-19 DIAGNOSIS — Z978 Presence of other specified devices: Secondary | ICD-10-CM

## 2015-05-04 ENCOUNTER — Ambulatory Visit (HOSPITAL_COMMUNITY): Payer: BLUE CROSS/BLUE SHIELD

## 2015-05-04 DIAGNOSIS — M436 Torticollis: Secondary | ICD-10-CM

## 2015-05-04 DIAGNOSIS — R293 Abnormal posture: Secondary | ICD-10-CM

## 2015-05-04 DIAGNOSIS — M25512 Pain in left shoulder: Secondary | ICD-10-CM | POA: Diagnosis not present

## 2015-05-04 DIAGNOSIS — M25612 Stiffness of left shoulder, not elsewhere classified: Secondary | ICD-10-CM

## 2015-05-04 DIAGNOSIS — M542 Cervicalgia: Secondary | ICD-10-CM

## 2015-05-04 DIAGNOSIS — R29898 Other symptoms and signs involving the musculoskeletal system: Secondary | ICD-10-CM

## 2015-05-04 NOTE — Therapy (Signed)
[place], [place], [address] Phone: [phone]   Fax:  [phone]  Physical Therapy Treatment  Patient Details  Name: Tiffany Ho MRN: [id number] Date of Birth: 1980/11/09 Referring Provider:  Chevis Pretty, *  Encounter Date: 05/04/2015      PT End of Session - 05/04/15 1742    Visit Number 9   Number of Visits 16   Authorization Type BCBS   Authorization Time Period 02/23/15 - 04/25/15   Authorization - Visit Number 9   Authorization - Number of Visits 16   PT Start Time 3748   PT Stop Time 1830   PT Time Calculation (min) 56 min   Activity Tolerance Patient tolerated treatment well   Behavior During Therapy [facility] for tasks assessed/performed      Past Medical History  Diagnosis Date  . Gallstones   . Anxiety   . Carpal tunnel syndrome, bilateral   . Seasonal allergies   . Panic attacks   . Constipation     Past Surgical History  Procedure Laterality Date  . Cesarean section    . Cholecystectomy  07/29/2012    Procedure: LAPAROSCOPIC CHOLECYSTECTOMY WITH INTRAOPERATIVE CHOLANGIOGRAM;  Surgeon: Adin [place], MD;  Location: [place];  Service: General;  Laterality: N/A;  . Neck pain      There were no vitals filed for this visit.  Visit Diagnosis:  Pain in joint, shoulder region, left  Weakness of left upper extremity  Shoulder stiffness, left  Bad posture  Neck stiffness  Cervicalgia      Subjective Assessment - 05/04/15 1735    Subjective It has been almost 4 weeks since last session for PT, pt. stated she is feeling all in all but continues to have burning in posterior neck, no reports of radicular symptoms down Lt UE in weeks.   How long can you sit comfortably? Able to sit comfortably for 45 minutes with no pain (was unable to sit still at all)   How long can you stand comfortably? Able to stand comfortably all day (was unable to rate abilty to stand still)   How  long can you walk comfortably? No difficulty.     Currently in Pain? No/denies   Pain Descriptors / Indicators Burning            [facility] PT Assessment - 05/04/15 0001    Assessment   Medical Diagnosis C5-6 HNP and C6 rediculitis   Onset Date/Surgical Date 01/23/15   Next MD Visit Laroy Apple unscheduled   Prior Therapy no   Observation/Other Assessments   Focus on Therapeutic Outcomes (FOTO)  44% limited (was 56% limited)  44% limited (was 56% limited)   ROM / Strength   AROM / PROM / Strength AROM;Strength   AROM   Overall AROM Comments Lumbar spine: ROM WNL   AROM Assessment Site Shoulder   Right/Left Shoulder Right;Left   Right Shoulder Flexion 180 Degrees   Right Shoulder ABduction 180 Degrees   Right Shoulder Horizontal ABduction --  was 42   Left Shoulder Flexion 180 Degrees   Left Shoulder ABduction 180 Degrees   Left Shoulder Horizontal ABduction 45 Degrees  was 26   Cervical Flexion --  was 70   Cervical Extension --  was 65   Cervical - Right Side Bend --  was 45   Cervical - Left Side Bend --  was 45   Cervical - Right Rotation --  was 70  Cervical - Left Rotation 80  was 62   Thoracic Flexion 80   Strength   Strength Assessment Site Shoulder   Right/Left Shoulder Right;Left   Right Shoulder Flexion 5/5   Right Shoulder ABduction 5/5   Left Shoulder Flexion 5/5  was 4+/5   Left Shoulder ABduction 4+/5  was 4+/5             [facility] Adult PT Treatment/Exercise - 05/04/15 0001    Exercises   Exercises Neck   Neck Exercises: Theraband   Scapula Retraction 15 reps;Blue   Scapula Retraction Limitations HEP   Shoulder Extension 15 reps;Blue   Shoulder Extension Limitations HEP   Rows 15 reps;Blue   Rows Limitations HEP   Neck Exercises: Seated   Neck Retraction 15 reps;5 secs   Cervical Rotation Both;10 reps   Cervical Rotation Limitations 3D cervical excursion   Lateral Flexion Both;10 reps   Lateral Flexion Limitations 3D cervical  excursion   W Back 10 reps   Other Seated Exercise 3D thoracic excursion 10   Other Seated Exercise scapular retraction   Manual Therapy   Manual Therapy Manual Traction   Manual Traction Manual cervical traction 60" holds in supine with LE elevated   Neck Exercises: Stretches   Upper Trapezius Stretch 3 reps;30 seconds             PT Short Term Goals - 05/04/15 1742    PT SHORT TERM GOAL #1   Title Patient will dmeonstrate thoracic spine extension of 20 degrees to improve upright posture   PT SHORT TERM GOAL #2   Title Patient will demonstrate increased cervical spine rotation to Lt to 75 degrees to more easily look over shoulder to look into blind spot while driving.    Status Achieved   PT SHORT TERM GOAL #3   Title Patient will demonstrate decreased pain to 3/10 while sitting for <79mnutes   Baseline Able to sit comfortably for 45 minutes   Status Achieved   PT SHORT TERM GOAL #4   Title Patient will be independent with HEP.    Baseline 05/04/2015:  Reports she is too busy with children to complete HEP, resumed Zumba and does complete stretches on occasion.   Status On-going           PT Long Term Goals - 05/04/15 1744    PT LONG TERM GOAL #1   Title Patient will dmeosntrate Lt UE horizontal abduction of 30 degrees indicaing improved pectoral mobility to more easily reach behind self.    Baseline 05/04/2015: AROM 42 degrees Lt UE horizontal abduction   Status Achieved   PT LONG TERM GOAL #2   Title Patient will demonstrate increased cervical spine rotation bilaterally to 80 degrees to more easily look over shoulders to look into blind spot while driving.    Status Achieved   PT LONG TERM GOAL #3   Title Patient will demonstrate Lt shoulder strength grossly 5/5 to be able to return to regular exercise routine   Status Achieved   PT LONG TERM GOAL #4   Title patient will be able to sleep 5 days a week through night waking only to use restroom   Baseline  05/04/2015:  Able to sleep the whole night through and not waking due to pain.   Status Achieved   PT LONG TERM GOAL #5   Title patient will be able to sit >2 hours withotu pain to drive long distances.    Baseline 05/04/2015: Stated she  hasn't drove for long periods of time, able to sit for 45 minutes without difficulty.     Status On-going               Plan - 05/04/15 1758    Clinical Impression Statement Reassessment complete with the following findings:  Pt reported pain has been significately reduced with burning sensations on Lt posterior neck she feels is related to stress at home and work.  Pt has improved ROM cervical, thoracic and Lt UE to WNL and no reports of pain or radicular symptsom through session.  Session focus on exercises to improve posture with HEP as well.  Pt able to demonstrate appropratie techniques with all exercises.  Pt does state decreased burning with manual traction.     PT Next Visit Plan Recommend D/C to HEP per most goals met.  Pt given advanced HEP to increase frequency at home.          Problem List Patient Active Problem List   Diagnosis Date Noted  . Hair loss 05/21/2014  . Other malaise and fatigue 05/21/2014  . Pain in joint, shoulder region 12/01/2013  . Cervicalgia 12/01/2013  . Gallstones 07/16/2012   Aldona Lento, PTA  Aldona Lento 05/04/2015, 6:40 PM   PHYSICAL THERAPY DISCHARGE SUMMARY  Visits from Start of Care: 9  Current functional level related to goals / functional outcomes: Patient had just returned today after 4 weeks of not attending therapy, however patient appears to be doing well with only remaining symptom some burning posterior neck. DC today to HEP as majority of goals have been met.    Remaining deficits: Burning sensation posterior neck, no other symptoms present    Education / Equipment: Plan of care to DC, HEP  Plan: Patient agrees to discharge.  Patient goals were partially met. Patient is  being discharged due to being pleased with the current functional level.  ?????      Deniece Ree PT, DPT [facility] [address] [place], [place], [address] Phone: [phone]   Fax:  [phone]

## 2015-05-04 NOTE — Patient Instructions (Addendum)
Neck: Retraction   Sit with back and head straight. Pull chin back to line up ear with shoulder. Do not turn or tilt head. May assist if child cannot keep correct position. Hold 5 seconds. Repeat 10 times. Do 1-2 sessions per day CAUTION: Movement should be gentle, steady and slow.  Copyright  VHI. All rights reserved.    SCAPULA: Retraction   Pinch shoulder blades together. Do not shrug shoulders. Hold 5 seconds.  10-20 reps per set, 1-2 sets per day, 3-5 days per week  Copyright  VHI. All rights reserved.

## 2015-05-06 ENCOUNTER — Ambulatory Visit (HOSPITAL_COMMUNITY): Payer: BLUE CROSS/BLUE SHIELD

## 2015-09-15 ENCOUNTER — Telehealth (HOSPITAL_COMMUNITY): Payer: Self-pay

## 2015-09-15 NOTE — Telephone Encounter (Signed)
Called concerning apt date and time  Jasminne Mealy, LPTA; CBIS [phone]  

## 2015-09-22 NOTE — Progress Notes (Signed)
Pt D/C on 05/04/2015, cancelled all remaining apts.  [address] [place]; CBIS [phone]

## 2015-12-29 ENCOUNTER — Telehealth: Payer: Self-pay | Admitting: Pediatrics

## 2015-12-29 DIAGNOSIS — Z20828 Contact with and (suspected) exposure to other viral communicable diseases: Secondary | ICD-10-CM

## 2015-12-29 MED ORDER — OSELTAMIVIR PHOSPHATE 75 MG PO CAPS: 75.0000 mg | ORAL_CAPSULE | Freq: Every day | ORAL | Status: DC

## 2015-12-29 NOTE — Telephone Encounter (Signed)
Son tested positive for flu B. Pt without symptoms, will give ppx.

## 2017-02-24 IMAGING — CT CT CERVICAL SPINE W/O CM
3 of 4 series · 10 of 33 positions shown, 11 images · non-contrast
Comparison: Cervical spine radiographs performed 12/15/2013

CLINICAL DATA: Chronic left shoulder pain for 2 months, radiating
down the left arm. Intermittent left hand numbness and
discoloration. Neck tightness. Initial encounter.

EXAM:
CT CERVICAL SPINE WITHOUT CONTRAST
TECHNIQUE: Multidetector CT imaging of the cervical spine was performed without
intravenous contrast. Multiplanar CT image reconstructions were also
generated.

[Series 4: cervical 2.0 st axial · axial · 0.29mm/px · z∈[+1151,+1209]mm · 2 of 87 slices shown, 3 images]
[im 29/87  soft-tissue]
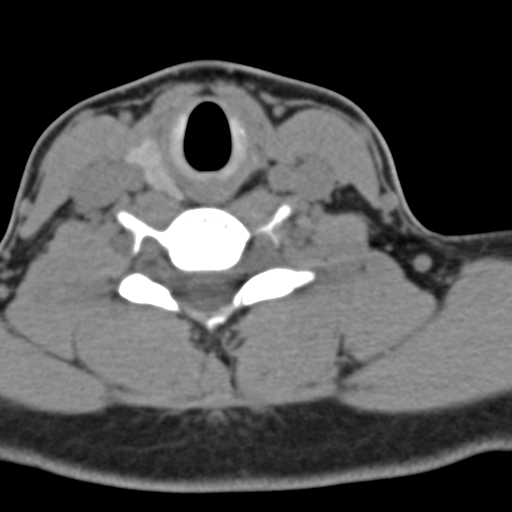
[im 29/87  bone]
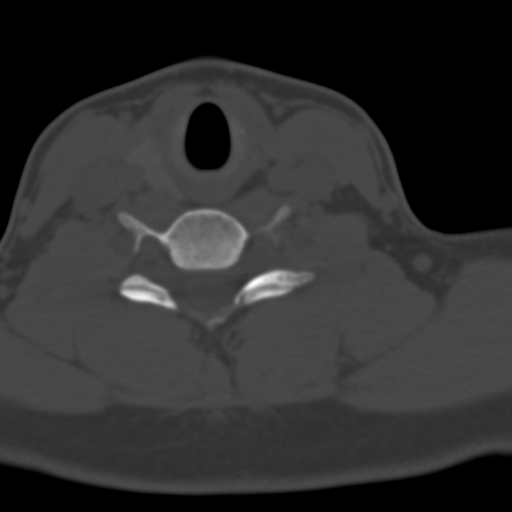
[im 58/87  bone]
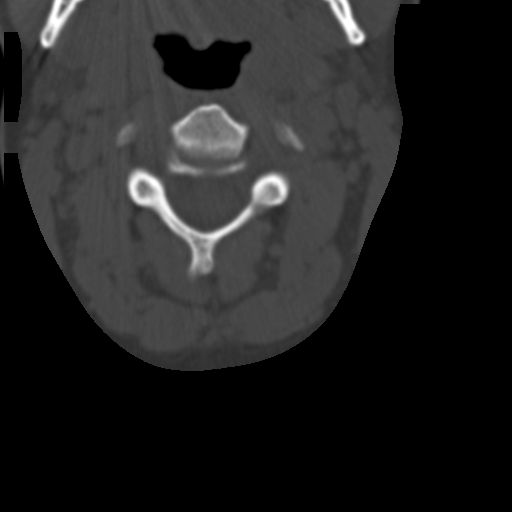

[Series 6: cervical spine sagittal bone · sagittal · 0.20mm/px · 5 of 58 slices shown]
[im 20/58  bone]
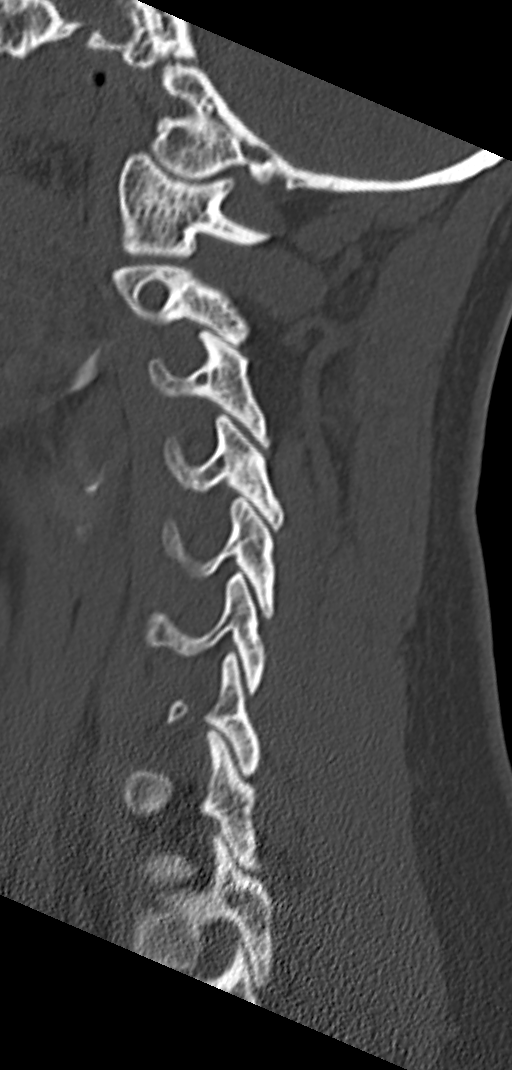
[im 24/58  bone]
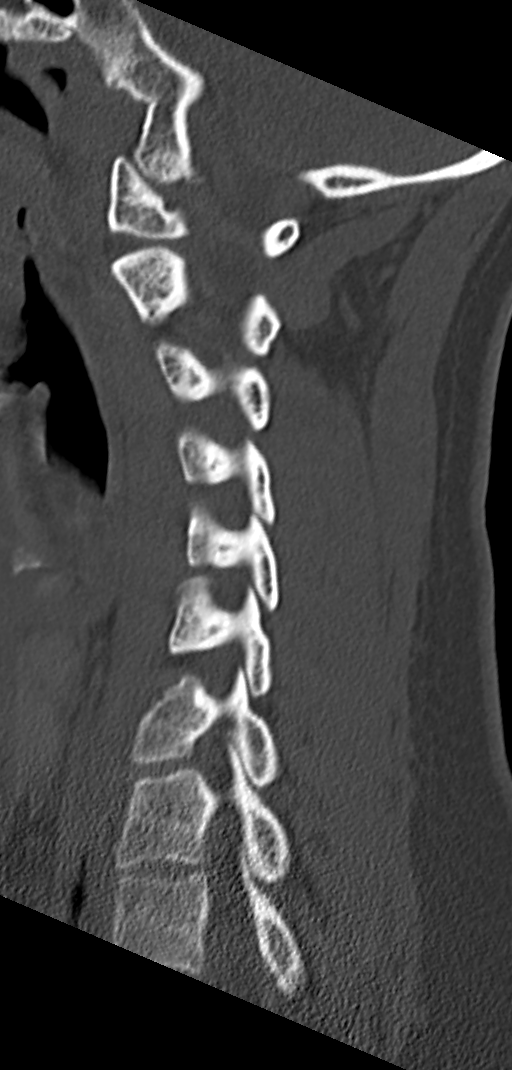
[im 29/58  bone]
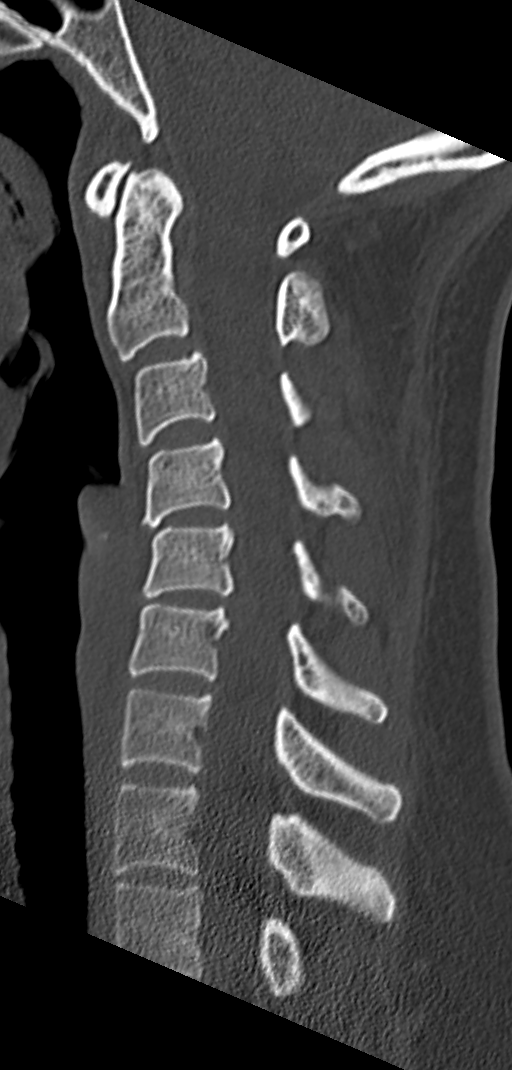
[im 34/58  bone]
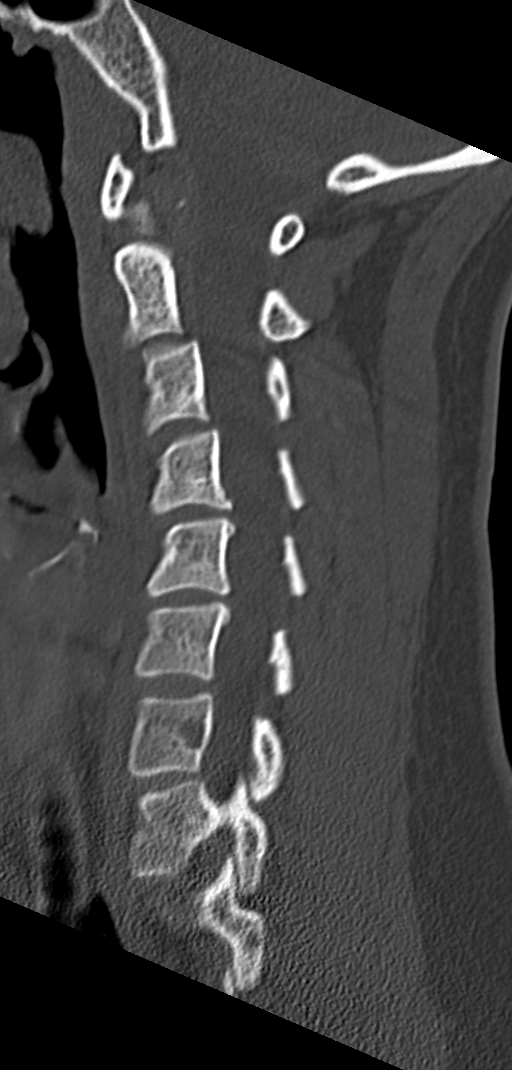
[im 39/58  bone]
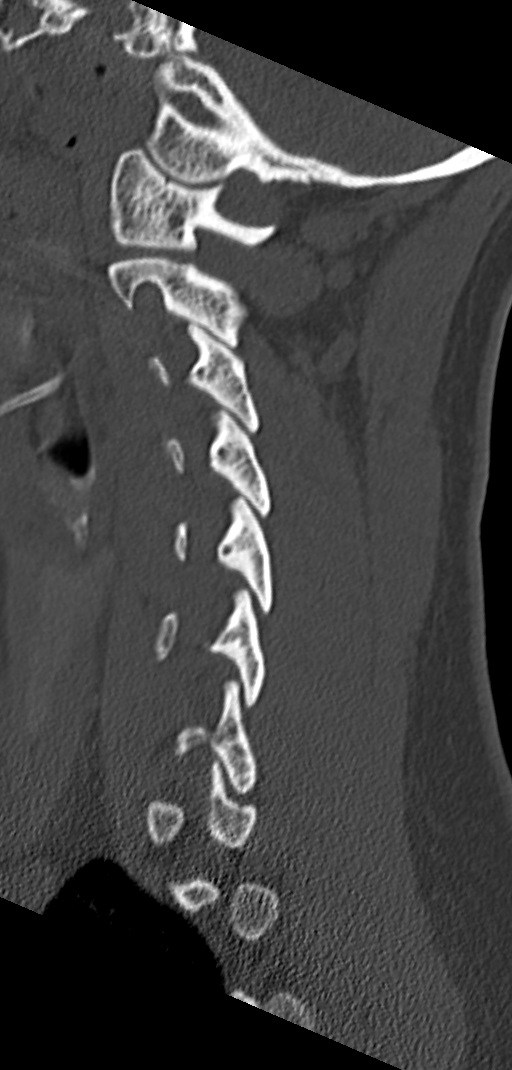

[Series 7: cervical spine coronal bone · coronal · 0.20mm/px · 3 of 43 slices shown]
[im 9/43  bone]
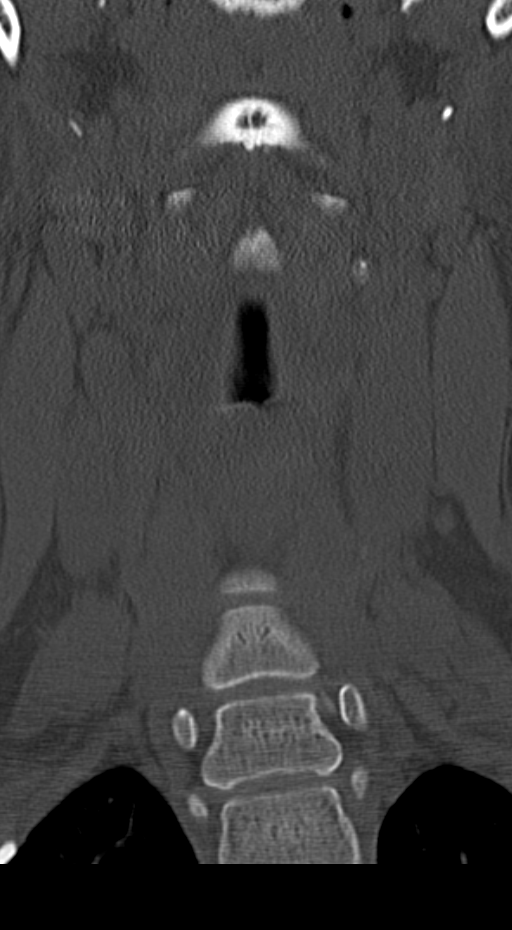
[im 17/43  bone]
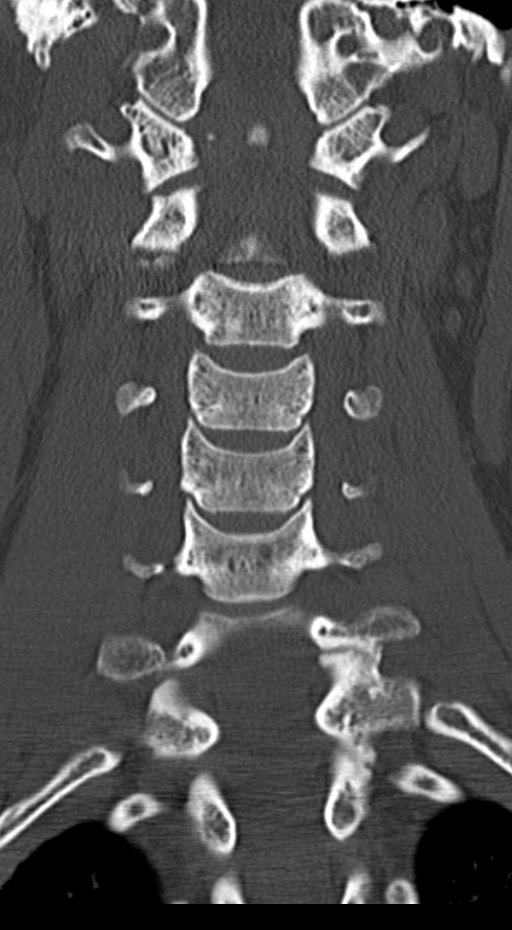
[im 26/43  bone]
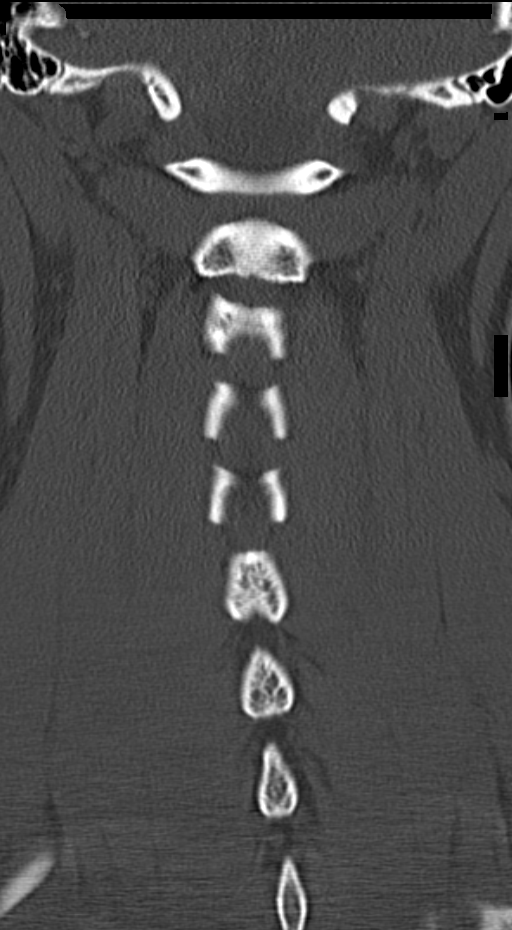

[10 of 33 positions shown; findings below may reference images not displayed]

FINDINGS: There is no evidence of fracture or subluxation. Mild reversal of
the normal lordotic curvature of the cervical spine appears to be
chronic in nature. Vertebral bodies demonstrate normal height and
alignment. Intervertebral disc spaces are preserved. Prevertebral
soft tissues are within normal limits.

There appears to be a large left paracentral disc protrusion at
C5-C6, measuring approximately 7 mm in AP dimension and 1.7 cm in
transverse dimension. Alternatively, much of this may reflect
underlying asymmetric thickening of the posterior longitudinal
ligament.

There is a 6 mm lucency within the right side of vertebral body C7.

The thyroid gland is unremarkable in appearance. The visualized lung
apices are clear. No significant soft tissue abnormalities are seen.
The visualized portions of the brain are unremarkable in appearance.
IMPRESSION: 1. Apparent large left paracentral disc protrusion at C5-C6,
measuring approximately 7 mm in AP dimension and 1.7 cm in
transverse dimension. Alternatively, much of this may reflect
underlying asymmetric thickening of the posterior longitudinal
ligament, as the intervertebral disc space is preserved. MRI of the
cervical spine is recommended for further evaluation.
2. No evidence of fracture or subluxation along the cervical spine.
3. 6 mm lucency noted within the right side of vertebral body C7.
Would correlate with labs to assess for the possibility of multiple
myeloma, though multiple myeloma is very unlikely given the
patient's age; alternatively, bone scan could be considered for
further evaluation.

## 2019-08-13 ENCOUNTER — Other Ambulatory Visit: Payer: Self-pay

## 2019-08-13 ENCOUNTER — Encounter (HOSPITAL_COMMUNITY): Payer: Self-pay | Admitting: Emergency Medicine

## 2019-08-13 ENCOUNTER — Emergency Department (HOSPITAL_COMMUNITY): Payer: Medicaid Other

## 2019-08-13 ENCOUNTER — Emergency Department (HOSPITAL_COMMUNITY)
Admission: EM | Admit: 2019-08-13 | Discharge: 2019-08-13 | Disposition: A | Discharge status: Home / Self Care | Payer: Medicaid Other | Attending: Emergency Medicine | Admitting: Emergency Medicine

## 2019-08-13 DIAGNOSIS — D259 Leiomyoma of uterus, unspecified: Secondary | ICD-10-CM | POA: Insufficient documentation

## 2019-08-13 DIAGNOSIS — N739 Female pelvic inflammatory disease, unspecified: Secondary | ICD-10-CM | POA: Diagnosis not present

## 2019-08-13 DIAGNOSIS — R1030 Lower abdominal pain, unspecified: Secondary | ICD-10-CM | POA: Diagnosis not present

## 2019-08-13 DIAGNOSIS — R102 Pelvic and perineal pain: Secondary | ICD-10-CM

## 2019-08-13 LAB — LIPASE, BLOOD: Lipase: 23 U/L (ref 11–51)

## 2019-08-13 LAB — COMPREHENSIVE METABOLIC PANEL
ALT: 18 U/L (ref 0–44)
AST: 21 U/L (ref 15–41)
Albumin: 3.8 g/dL (ref 3.5–5.0)
Alkaline Phosphatase: 73 U/L (ref 38–126)
Anion gap: 11 (ref 5–15)
BUN: 10 mg/dL (ref 6–20)
CO2: 22 mmol/L (ref 22–32)
Calcium: 9.1 mg/dL (ref 8.9–10.3)
Chloride: 101 mmol/L (ref 98–111)
Creatinine, Ser: 0.65 mg/dL (ref 0.44–1.00)
GFR calc Af Amer: >60 mL/min (ref >60)
GFR calc non Af Amer: >60 mL/min (ref >60)
Glucose, Bld: 100 mg/dL — ABNORMAL HIGH (ref 70–99)
Potassium: 3.9 mmol/L (ref 3.5–5.1)
Sodium: 134 mmol/L — ABNORMAL LOW (ref 135–145)
Total Bilirubin: 0.4 mg/dL (ref 0.3–1.2)
Total Protein: 7.6 g/dL (ref 6.5–8.1)

## 2019-08-13 LAB — CBC
HCT: 42.1 % (ref 36.0–46.0)
Hemoglobin: 13.8 g/dL (ref 12.0–15.0)
MCH: 29.1 pg (ref 26.0–34.0)
MCHC: 32.8 g/dL (ref 30.0–36.0)
MCV: 88.6 fL (ref 80.0–100.0)
Platelets: 477 10*3/uL — ABNORMAL HIGH (ref 150–400)
RBC: 4.75 MIL/uL (ref 3.87–5.11)
RDW: 12.5 % (ref 11.5–15.5)
WBC: 12.8 10*3/uL — ABNORMAL HIGH (ref 4.0–10.5)
nRBC: 0 % (ref 0.0–0.2)

## 2019-08-13 LAB — WET PREP, GENITAL
Clue Cells Wet Prep HPF POC: NONE SEEN
Sperm: NONE SEEN
Trich, Wet Prep: NONE SEEN
Yeast Wet Prep HPF POC: NONE SEEN

## 2019-08-13 LAB — URINALYSIS, ROUTINE W REFLEX MICROSCOPIC
Bilirubin Urine: NEGATIVE
Glucose, UA: NEGATIVE mg/dL
Hgb urine dipstick: NEGATIVE
Ketones, ur: 5 mg/dL — AB
Leukocytes,Ua: NEGATIVE
Nitrite: NEGATIVE
Protein, ur: NEGATIVE mg/dL
Specific Gravity, Urine: 1.017 (ref 1.005–1.030)
pH: 5 (ref 5.0–8.0)

## 2019-08-13 LAB — PREGNANCY, URINE: Preg Test, Ur: NEGATIVE

## 2019-08-13 MED ORDER — ONDANSETRON 4 MG PO TBDP: 4.0000 mg | ORAL_TABLET | Freq: Three times a day (TID) | ORAL | 0 refills | Status: DC | PRN

## 2019-08-13 MED ORDER — MORPHINE SULFATE (PF) 4 MG/ML IV SOLN
4.0000 mg | Freq: Once | INTRAVENOUS | Status: AC
  Administered 2019-08-13: 18:00:00 4 mg via INTRAVENOUS
  Filled 2019-08-13: qty 1

## 2019-08-13 MED ORDER — IOHEXOL 300 MG/ML  SOLN
100.0000 mL | Freq: Once | INTRAMUSCULAR | Status: AC | PRN
  Administered 2019-08-13: 100 mL via INTRAVENOUS

## 2019-08-13 MED ORDER — ONDANSETRON HCL 4 MG/2ML IJ SOLN
4.0000 mg | Freq: Once | INTRAMUSCULAR | Status: AC
  Administered 2019-08-13: 15:00:00 4 mg via INTRAVENOUS
  Filled 2019-08-13: qty 2

## 2019-08-13 MED ORDER — METRONIDAZOLE 500 MG PO TABS: 500.0000 mg | ORAL_TABLET | Freq: Two times a day (BID) | ORAL | 0 refills | Status: AC

## 2019-08-13 MED ORDER — CEFTRIAXONE SODIUM 250 MG IJ SOLR
250.0000 mg | Freq: Once | INTRAMUSCULAR | Status: AC
  Administered 2019-08-13: 20:00:00 250 mg via INTRAMUSCULAR
  Filled 2019-08-13: qty 250

## 2019-08-13 MED ORDER — DOXYCYCLINE HYCLATE 100 MG PO CAPS: 100.0000 mg | ORAL_CAPSULE | Freq: Two times a day (BID) | ORAL | 0 refills | Status: AC

## 2019-08-13 MED ORDER — SODIUM CHLORIDE 0.9 % IV BOLUS
1000.0000 mL | Freq: Once | INTRAVENOUS | Status: AC
  Administered 2019-08-13: 1000 mL via INTRAVENOUS

## 2019-08-13 MED ORDER — DOXYCYCLINE HYCLATE 100 MG PO TABS
100.0000 mg | ORAL_TABLET | Freq: Once | ORAL | Status: AC
  Administered 2019-08-13: 20:00:00 100 mg via ORAL
  Filled 2019-08-13: qty 1

## 2019-08-13 MED ORDER — MORPHINE SULFATE (PF) 4 MG/ML IV SOLN
4.0000 mg | Freq: Once | INTRAVENOUS | Status: AC
  Administered 2019-08-13: 4 mg via INTRAVENOUS
  Filled 2019-08-13: qty 1

## 2019-08-13 NOTE — ED Triage Notes (Signed)
Lower abd pain and lower back pain x 1week.  More pain with movement.  Denies urinary s/s.

## 2019-08-13 NOTE — ED Provider Notes (Signed)
[facility] EMERGENCY DEPARTMENT Provider Note   CSN: [id number] Arrival date & time: 08/13/19  1157     History   Chief Complaint Chief Complaint  Patient presents with   Abdominal Pain    HPI Tiffany Ho is a 38 y.o. female with past medical history of anxiety, status post cholecystectomy, who presents today for evaluation of lower abdominal pain.  She reports that for the past week she has had worsening bilateral lower abdominal pain that radiates into her back.  She states that 3 months ago she saw her OB/GYN and was started on NuvaRing for birth control.  She states that she has not had a menstrual cycle since 2014.  She reports that she has been using hot water bottle with mild improvement.  She took out the NuvaRing 2 days ago after pain had already started.  She reports that she is sexually active however is not concerned about gonorrhea or chlamydia.  She denies any abnormal vaginal bleeding or discharge.  She states that she thinks she may have had cysts on her ovaries in the past.  She denies any history of injection drug use or cancer. She feels that the pain is primarily in her pelvis radiating into the back.  She denies any injury.  Her last bowel movement was 2 days ago after she took medication.  No blood in her BM.      HPI  Past Medical History:  Diagnosis Date   Anxiety    Carpal tunnel syndrome, bilateral    Constipation    Gallstones    Panic attacks    Seasonal allergies     Patient Active Problem List   Diagnosis Date Noted   Hair loss 05/21/2014   Other malaise and fatigue 05/21/2014   Pain in joint, shoulder region 12/01/2013   Cervicalgia 12/01/2013   Gallstones 07/16/2012    Past Surgical History:  Procedure Laterality Date   CESAREAN SECTION     CHOLECYSTECTOMY  07/29/2012   Procedure: LAPAROSCOPIC CHOLECYSTECTOMY WITH INTRAOPERATIVE CHOLANGIOGRAM;  Surgeon: Adin Hector, MD;  Location: [place];  Service: General;   Laterality: N/A;   neck pain       OB History   No obstetric history on file.      Home Medications    Prior to Admission medications   Medication Sig Start Date End Date Taking? Authorizing Provider  etonogestrel-ethinyl estradiol (NUVARING) 0.12-0.015 MG/24HR vaginal ring Place 1 each vaginally every 28 (twenty-eight) days. Insert vaginally and leave in place for 3 consecutive weeks, then remove for 1 week.   Yes [provider]  traMADol (ULTRAM) 50 MG tablet Take 1 tablet (50 mg total) by mouth every 6 (six) hours as needed. 01/04/15  Yes Neese, [place], NP  docusate sodium (COLACE) 100 MG capsule Take 1 capsule (100 mg total) by mouth every 12 (twelve) hours. Patient not taking: Reported on 08/13/2019 02/18/15   Ward, Delice Bison, DO  doxycycline (VIBRAMYCIN) 100 MG capsule Take 1 capsule (100 mg total) by mouth 2 (two) times daily for 14 days. 08/13/19 08/27/19  Lorin Glass, PA-C  methocarbamol (ROBAXIN) 500 MG tablet Take 1 tablet (500 mg total) by mouth 2 (two) times daily. Patient not taking: Reported on 08/13/2019 01/04/15   Ashley Murrain, NP  metroNIDAZOLE (FLAGYL) 500 MG tablet Take 1 tablet (500 mg total) by mouth 2 (two) times daily for 14 days. 08/13/19 08/27/19  Lorin Glass, PA-C  omeprazole (PRILOSEC) 40 MG capsule Take  1 capsule (40 mg total) by mouth daily. Patient not taking: Reported on 08/13/2019 02/18/15   Ward, Delice Bison, DO  ondansetron (ZOFRAN ODT) 4 MG disintegrating tablet Take 1 tablet (4 mg total) by mouth every 8 (eight) hours as needed for nausea or vomiting. 08/13/19   Lorin Glass, PA-C  oseltamivir (TAMIFLU) 75 MG capsule Take 1 capsule (75 mg total) by mouth daily. Patient not taking: Reported on 08/13/2019 12/29/15   Eustaquio Maize, MD  oxyCODONE-acetaminophen (PERCOCET/ROXICET) 5-325 MG per tablet Take 1 tablet by mouth every 4 (four) hours as needed. Patient not taking: Reported on 08/13/2019 02/18/15   Ward, Delice Bison, DO    polyethylene glycol (MIRALAX / GLYCOLAX) packet Take 17 g by mouth daily. Patient not taking: Reported on 08/13/2019 02/18/15   Ward, Delice Bison, DO    Family History No family history on file.  Social History Social History   Tobacco Use   Smoking status: Never Smoker  Substance Use Topics   Alcohol use: No   Drug use: No     Allergies   Vicodin [hydrocodone-acetaminophen]   Review of Systems Review of Systems  Constitutional: Negative for chills and fever.  Gastrointestinal: Positive for abdominal pain. Negative for constipation, diarrhea, nausea and vomiting.  Genitourinary: Positive for pelvic pain. Negative for difficulty urinating, dysuria, flank pain, hematuria, menstrual problem, urgency, vaginal bleeding, vaginal discharge and vaginal pain.  Musculoskeletal: Positive for back pain. Negative for neck pain.  All other systems reviewed and are negative.    Physical Exam Updated Vital Signs BP 106/71    Pulse 86    Temp 98.3 F (36.8 C) (Oral)    Resp 18    Ht 5\' 4"  (1.626 m)    Wt 88.5 kg    SpO2 96%    BMI 33.47 kg/m   Physical Exam Vitals signs and nursing note reviewed. Exam conducted with a chaperone present (female Therapist, sports).  Constitutional:      General: She is not in acute distress.    Appearance: She is well-developed. She is not diaphoretic.  HENT:     Head: Normocephalic and atraumatic.  Eyes:     General: No scleral icterus.       Right eye: No discharge.        Left eye: No discharge.     Conjunctiva/sclera: Conjunctivae normal.  Neck:     Musculoskeletal: Normal range of motion.  Cardiovascular:     Rate and Rhythm: Normal rate and regular rhythm.  Pulmonary:     Effort: Pulmonary effort is normal. No respiratory distress.     Breath sounds: No stridor.  Abdominal:     General: There is no distension.  Genitourinary:    Comments: Speculum exam produced increased pain and cramping.  After pelvic exam was performed patient became lightheaded  and slightly diaphoretic.  She became nauseous and attempted to vomit.  She states that this was all due to the increased pain.   Normal external female genitalia.  No obvious vaginal discharge or bleeding.  Generalized fullness in bilateral necks is in midline.  No clear cervical motion tenderness. Musculoskeletal:        General: No deformity.  Skin:    General: Skin is warm and dry.  Neurological:     Mental Status: She is alert.     Motor: No abnormal muscle tone.  Psychiatric:        Behavior: Behavior normal.      ED Treatments / Results  Labs (all labs ordered are listed, but only abnormal results are displayed) Labs Reviewed  WET PREP, GENITAL - Abnormal; Notable for the following components:      Result Value   WBC, Wet Prep HPF POC MODERATE (*)    All other components within normal limits  COMPREHENSIVE METABOLIC PANEL - Abnormal; Notable for the following components:   Sodium 134 (*)    Glucose, Bld 100 (*)    All other components within normal limits  CBC - Abnormal; Notable for the following components:   WBC 12.8 (*)    Platelets 477 (*)    All other components within normal limits  URINALYSIS, ROUTINE W REFLEX MICROSCOPIC - Abnormal; Notable for the following components:   APPearance HAZY (*)    Ketones, ur 5 (*)    All other components within normal limits  LIPASE, BLOOD  PREGNANCY, URINE  GC/CHLAMYDIA PROBE AMP ([place]) NOT AT [facility]    EKG None  Radiology Ct Abdomen Pelvis W Contrast  Result Date: 08/13/2019 CLINICAL DATA:  Pelvic pain for 1 week EXAM: CT ABDOMEN AND PELVIS WITH CONTRAST TECHNIQUE: Multidetector CT imaging of the abdomen and pelvis was performed using the standard protocol following bolus administration of intravenous contrast. CONTRAST:  132mL OMNIPAQUE IOHEXOL 300 MG/ML  SOLN COMPARISON:  Ultrasound from earlier in the same day. FINDINGS: Lower chest: No acute abnormality. Hepatobiliary: No focal liver abnormality is seen. Status  post cholecystectomy. No biliary dilatation. Pancreas: Unremarkable. No pancreatic ductal dilatation or surrounding inflammatory changes. Spleen: Normal in size without focal abnormality. Adrenals/Urinary Tract: Adrenal glands are within normal limits. Kidneys are well visualized bilaterally. Tiny nonobstructing stones are noted in the lower pole on the right. The ureters are well visualized and within normal limits. The bladder is partially distended. Stomach/Bowel: Colon demonstrates mild diverticular change. No evidence of diverticulitis is seen. The appendix is within normal limits. No small bowel or gastric abnormality is seen. Vascular/Lymphatic: No significant lymphadenopathy is noted. Left retroaortic renal vein is seen. Reproductive: Uterus is well visualized with multiple uterine fibroids. The largest of these measures approximately 6.7 cm in greatest dimension. The ovaries are well visualized bilaterally. Mild follicular changes are noted on the right. Other: No abdominal wall hernia or abnormality. No abdominopelvic ascites. Musculoskeletal: No acute or significant osseous findings. IMPRESSION: Tiny nonobstructing right lower pole renal stones. Diverticulosis without diverticulitis. Multiple uterine fibroids as described. No specific ovarian abnormality is seen. Electronically Signed   By: Inez Catalina M.D.   On: 08/13/2019 18:24   US Pelvic Complete W Transvaginal And Torsion R/o  Result Date: 08/13/2019 CLINICAL DATA:  Pelvic pain for the past week. EXAM: TRANSABDOMINAL AND TRANSVAGINAL ULTRASOUND OF PELVIS DOPPLER ULTRASOUND OF OVARIES TECHNIQUE: Both transabdominal and transvaginal ultrasound examinations of the pelvis were performed. Transabdominal technique was performed for global imaging of the pelvis including uterus, ovaries, adnexal regions, and pelvic cul-de-sac. It was necessary to proceed with endovaginal exam following the transabdominal exam to visualize the ovaries. Color and duplex  Doppler ultrasound was utilized to evaluate blood flow to the ovaries. COMPARISON:  None. FINDINGS: Uterus Measurements: 10.1 x 7.3 x 5.8 cm = volume: 223 mL. Multiple uterine fibroids, the largest of which is located at the fundus, measures 5.3 x 5.1 x 4.2 cm, and has a submucosal component. Endometrium Thickness: 5 mm. Partially obscured by the submucosal uterine fibroid. No focal abnormality visualized. Right ovary Measurements: 4.2 x 2.4 x 3.0 cm = volume: 16 mL. Normal appearance/no adnexal mass. Left ovary  Measurements: 2.7 x 1.6 x 3.6 cm = volume: 8 mL. Normal appearance/no adnexal mass. Pulsed Doppler evaluation of both ovaries demonstrates normal low-resistance arterial and venous waveforms in the right ovary. Normal venous waveform of the left ovary. A low resistance arterial waveform of the left ovary is not visualized. Other findings No abnormal free fluid. IMPRESSION: 1. Limited evaluation of the left ovary due to bowel gas and patient movement. A normal low resistance arterial waveform is not visualized. Although torsion is not entirely excluded, the absence of an arterial waveform may be technical, as the ovary is normal in size and demonstrates normal venous flow. Correlate with pelvic exam and consider CT for further evaluation. 2. Fibroid uterus, including a 5.3 cm fundal fibroid with a submucosal component. Electronically Signed   By: Titus Dubin M.D.   On: 08/13/2019 17:18    Procedures Procedures (including critical care time)  Medications Ordered in ED Medications  morphine 4 MG/ML injection 4 mg (4 mg Intravenous Given 08/13/19 1526)  ondansetron (ZOFRAN) injection 4 mg (4 mg Intravenous Given 08/13/19 1525)  sodium chloride 0.9 % bolus 1,000 mL (0 mLs Intravenous Stopped 08/13/19 1911)  morphine 4 MG/ML injection 4 mg (4 mg Intravenous Given 08/13/19 1754)  iohexol (OMNIPAQUE) 300 MG/ML solution 100 mL (100 mLs Intravenous Contrast Given 08/13/19 1741)  cefTRIAXone (ROCEPHIN)  injection 250 mg (250 mg Intramuscular Given 08/13/19 1955)  doxycycline (VIBRA-TABS) tablet 100 mg (100 mg Oral Given 08/13/19 1955)     Initial Impression / Assessment and Plan / ED Course  I have reviewed the triage vital signs and the nursing notes.  Pertinent labs & imaging results that were available during my care of the patient were reviewed by me and considered in my medical decision making (see chart for details).  Clinical Course as of Aug 12 2222  Wed Aug 13, 2019  1732 Patient reevaluated.  She remains unchanged.  She reports that her pain worsened during ultrasound.  Ultrasound shows multiple fibroids and they were unable to visualize her left ovary.  CT scan was recommended by radiology.  CT scan ordered.   [EH]    Clinical Course User Index [EH] Lorin Glass, PA-C      Murray Hodgkins presents today for evaluation of lower abdominal pain that is been going on for approximately 1 week.  On exam she does appear to be uncomfortable.  Labs were obtained and reviewed she has a mild leukocytosis of 12.8.  She does not have any significant electrolyte derangements and is not anemic.  Pelvic exam was performed with chaperone during which she had a large amount of pelvic cramping and after exam had a vagal near syncopal event due to her reported cramping.  Wet prep positive for white blood cells.  Pain was treated with morphine, her nausea was treated with Zofran and she was given IV fluids.  Urine does not appear to be infected.  Pelvic ultrasound was obtained with the left ovary nonvisualized and showing fibroids with recommendation for CT scan.  CT scan was obtained showing fibroid uterus including a 6.7 cm fundal fibroid.  She was informed that she has evidence of diverticulosis without diverticulitis.  CT scan is otherwise unremarkable.  Based on the degree of her symptoms we will treat for pelvic inflammatory disease.  She is given a shot of Rocephin while in the emergency  room and given prescriptions for Flagyl and doxycycline.  She will instructed not to have sexual contact during this time  and to follow-up with her OB/GYN.  She was informed of her multiple fibroids.  Return precautions were discussed with patient who states their understanding.  At the time of discharge patient denied any unaddressed complaints or concerns.  Patient is agreeable for discharge home.   Final Clinical Impressions(s) / ED Diagnoses   Final diagnoses:  Lower abdominal pain  Uterine leiomyoma, unspecified location  Pelvic inflammatory disease (PID)    ED Discharge Orders         Ordered    doxycycline (VIBRAMYCIN) 100 MG capsule  2 times daily     08/13/19 1938    metroNIDAZOLE (FLAGYL) 500 MG tablet  2 times daily     08/13/19 1938    ondansetron (ZOFRAN ODT) 4 MG disintegrating tablet  Every 8 hours PRN     08/13/19 1938           Lorin Glass, PA-C 08/13/19 2224    Sherwood Gambler, MD 08/15/19 [phone]

## 2019-08-13 NOTE — ED Notes (Signed)
Patient transported to CT 

## 2019-08-13 NOTE — Discharge Instructions (Addendum)

## 2019-08-15 LAB — GC/CHLAMYDIA PROBE AMP (~~LOC~~) NOT AT ARMC
Chlamydia: NEGATIVE
Neisseria Gonorrhea: NEGATIVE

## 2021-06-02 ENCOUNTER — Other Ambulatory Visit: Payer: Self-pay | Admitting: Neurosurgery

## 2021-06-10 NOTE — Pre-Procedure Instructions (Signed)
Surgical Instructions    Your procedure is scheduled on Tuesday, August 9th.  Report to [facility] Main Entrance "A" at 6:00 A.M., then check in with the Admitting office.  Call this number if you have problems the morning of surgery:  [phone]   If you have any questions prior to your surgery date call [phone]: Open Monday-Friday 8am-4pm    Remember:  Do not eat or drink after midnight the night before your surgery    Take these medicines the morning of surgery with A SIP OF WATER  NEURONTIN methocarbamol (ROBAXIN)-as needed  oxyCODONE-acetaminophen (PERCOCET/ROXICET)-as needed   As of today, STOP taking any Aspirin (unless otherwise instructed by your surgeon) Aleve, Naproxen, Ibuprofen, Motrin, Advil, Goody's, BC's, all herbal medications, fish oil, and all vitamins.                     Do NOT Smoke (Tobacco/Vaping) or drink Alcohol 24 hours prior to your procedure.  If you use a CPAP at night, you may bring all equipment for your overnight stay.   Contacts, glasses, piercing's, hearing aid's, dentures or partials may not be worn into surgery, please bring cases for these belongings.    For patients admitted to the hospital, discharge time will be determined by your treatment team.   Patients discharged the day of surgery will not be allowed to drive home, and someone needs to stay with them for 24 hours.  ONLY 1 SUPPORT PERSON MAY BE PRESENT WHILE YOU ARE IN SURGERY. IF YOU ARE TO BE ADMITTED ONCE YOU ARE IN YOUR ROOM YOU WILL BE ALLOWED TWO (2) VISITORS.  Minor children may have two parents present. Special consideration for safety and communication needs will be reviewed on a case by case basis.   Special instructions:   [place]- Preparing For Surgery  Before surgery, you can play an important role. Because skin is not sterile, your skin needs to be as free of germs as possible. You can reduce the number of germs on your skin by washing with CHG  (chlorahexidine gluconate) Soap before surgery.  CHG is an antiseptic cleaner which kills germs and bonds with the skin to continue killing germs even after washing.    Oral Hygiene is also important to reduce your risk of infection.  Remember - BRUSH YOUR TEETH THE MORNING OF SURGERY WITH YOUR REGULAR TOOTHPASTE  Please do not use if you have an allergy to CHG or antibacterial soaps. If your skin becomes reddened/irritated stop using the CHG.  Do not shave (including legs and underarms) for at least 48 hours prior to first CHG shower. It is OK to shave your face.  Please follow these instructions carefully.   Shower the NIGHT BEFORE SURGERY and the MORNING OF SURGERY  If you chose to wash your hair, wash your hair first as usual with your normal shampoo.  After you shampoo, rinse your hair and body thoroughly to remove the shampoo.  Use CHG Soap as you would any other liquid soap. You can apply CHG directly to the skin and wash gently with a scrungie or a clean washcloth.   Apply the CHG Soap to your body ONLY FROM THE NECK DOWN.  Do not use on open wounds or open sores. Avoid contact with your eyes, ears, mouth and genitals (private parts). Wash Face and genitals (private parts)  with your normal soap.   Wash thoroughly, paying special attention to the area where your surgery will be performed.  Thoroughly rinse your body with warm water from the neck down.  DO NOT shower/wash with your normal soap after using and rinsing off the CHG Soap.  Pat yourself dry with a CLEAN TOWEL.  Wear CLEAN PAJAMAS to bed the night before surgery  Place CLEAN SHEETS on your bed the night before your surgery  DO NOT SLEEP WITH PETS.   Day of Surgery: Shower with CHG soap. Do not wear jewelry, make up, nail polish, gel polish, artificial nails, or any other type of covering on natural nails including finger and toenails. If patients have artificial nails, gel coating, etc. that need to be removed  by a nail salon please have this removed prior to surgery. Surgery may need to be canceled/delayed if the surgeon/ anesthesia feels like the patient is unable to be adequately monitored. Do not wear lotions, powders, perfumes, or deodorant. Do not shave 48 hours prior to surgery.  Do not bring valuables to the hospital. [facility] is not responsible for any belongings or valuables. Wear Clean/Comfortable clothing the morning of surgery Remember to brush your teeth WITH YOUR REGULAR TOOTHPASTE.   Please read over the following fact sheets that you were given.

## 2021-06-13 ENCOUNTER — Encounter (HOSPITAL_COMMUNITY)
Admission: RE | Admit: 2021-06-13 | Discharge: 2021-06-13 | Disposition: A | Discharge status: Home / Self Care | Payer: BC Managed Care – PPO | Source: Ambulatory Visit | Attending: Neurosurgery | Admitting: Neurosurgery

## 2021-06-13 ENCOUNTER — Encounter (HOSPITAL_COMMUNITY): Payer: Self-pay

## 2021-06-13 ENCOUNTER — Other Ambulatory Visit: Payer: Self-pay

## 2021-06-13 ENCOUNTER — Encounter (HOSPITAL_COMMUNITY): Payer: Self-pay | Admitting: Neurosurgery

## 2021-06-13 DIAGNOSIS — Z20822 Contact with and (suspected) exposure to covid-19: Secondary | ICD-10-CM | POA: Diagnosis not present

## 2021-06-13 DIAGNOSIS — Z01812 Encounter for preprocedural laboratory examination: Secondary | ICD-10-CM | POA: Diagnosis not present

## 2021-06-13 LAB — BASIC METABOLIC PANEL
Anion gap: 9 (ref 5–15)
BUN: 6 mg/dL (ref 6–20)
CO2: 23 mmol/L (ref 22–32)
Calcium: 9 mg/dL (ref 8.9–10.3)
Chloride: 107 mmol/L (ref 98–111)
Creatinine, Ser: 0.72 mg/dL (ref 0.44–1.00)
GFR, Estimated: >60 mL/min (ref >60)
Glucose, Bld: 95 mg/dL (ref 70–99)
Potassium: 3.6 mmol/L (ref 3.5–5.1)
Sodium: 139 mmol/L (ref 135–145)

## 2021-06-13 LAB — CBC WITH DIFFERENTIAL/PLATELET
Abs Immature Granulocytes: 0.02 10*3/uL (ref 0.00–0.07)
Basophils Absolute: 0.1 10*3/uL (ref 0.0–0.1)
Basophils Relative: 1 %
Eosinophils Absolute: 0.7 10*3/uL — ABNORMAL HIGH (ref 0.0–0.5)
Eosinophils Relative: 8 %
HCT: 42.4 % (ref 36.0–46.0)
Hemoglobin: 14.2 g/dL (ref 12.0–15.0)
Immature Granulocytes: 0 %
Lymphocytes Relative: 27 %
Lymphs Abs: 2.4 10*3/uL (ref 0.7–4.0)
MCH: 29.4 pg (ref 26.0–34.0)
MCHC: 33.5 g/dL (ref 30.0–36.0)
MCV: 87.8 fL (ref 80.0–100.0)
Monocytes Absolute: 0.7 10*3/uL (ref 0.1–1.0)
Monocytes Relative: 8 %
Neutro Abs: 4.9 10*3/uL (ref 1.7–7.7)
Neutrophils Relative %: 56 %
Platelets: 456 10*3/uL — ABNORMAL HIGH (ref 150–400)
RBC: 4.83 MIL/uL (ref 3.87–5.11)
RDW: 12.8 % (ref 11.5–15.5)
WBC: 8.8 10*3/uL (ref 4.0–10.5)
nRBC: 0 % (ref 0.0–0.2)

## 2021-06-13 LAB — SARS CORONAVIRUS 2 (TAT 6-24 HRS): SARS Coronavirus 2: NEGATIVE

## 2021-06-13 LAB — SURGICAL PCR SCREEN
MRSA, PCR: NEGATIVE
Staphylococcus aureus: NEGATIVE

## 2021-06-13 NOTE — Progress Notes (Signed)
PCP - Dr. Kayleen Memos at [facility] Cardiologist - denies  Chest x-ray - n/a EKG - n/a Stress Test - denies  ECHO - denies Cardiac Cath - denies  Sleep Study - denies CPAP - denies  Blood Thinner Instructions: n/a Aspirin Instructions: n/a  COVID TEST- 06/13/21; done in PAT. No hx of COVID-19 infection in the last 90 days.   Anesthesia review: No  Patient denies shortness of breath, fever, cough and chest pain at PAT appointment   All instructions explained to the patient, with a verbal understanding of the material. Patient agrees to go over the instructions while at home for a better understanding. Patient also instructed to self quarantine after being tested for COVID-19. The opportunity to ask questions was provided.

## 2021-06-13 NOTE — Anesthesia Preprocedure Evaluation (Addendum)
Anesthesia Evaluation  Patient identified by MRN, date of birth, ID band Patient awake    Reviewed: Allergy & Precautions, NPO status , Patient's Chart, lab work & pertinent test results  Airway Mallampati: II  TM Distance: >3 FB Neck ROM: Full    Dental  (+) Teeth Intact, Dental Advisory Given   Pulmonary neg pulmonary ROS,    Pulmonary exam normal breath sounds clear to auscultation       Cardiovascular negative cardio ROS Normal cardiovascular exam Rhythm:Regular Rate:Normal     Neuro/Psych Anxiety Hx/o panic attacks Neuromuscular disease    GI/Hepatic negative GI ROS, Neg liver ROS,   Endo/Other  Obesity  Renal/GU negative Renal ROS  negative genitourinary   Musculoskeletal 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, 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   Abdominal (+) + obese,   Peds  Hematology negative hematology ROS (+)   Anesthesia Other Findings   Reproductive/Obstetrics                           Anesthesia Physical Anesthesia Plan  ASA: 2  Anesthesia Plan: General   Post-op Pain Management:    Induction: Intravenous  PONV Risk Score and Plan: 4 or greater and Treatment may vary due to age or medical condition, Scopolamine patch - Pre-op, Midazolam, Ondansetron and Dexamethasone  Airway Management Planned: Oral ETT and Video Laryngoscope Planned  Additional Equipment:   Intra-op Plan:   Post-operative Plan: Extubation in OR  Informed Consent: I have reviewed the patients History and Physical, chart, labs and discussed the procedure including the risks, benefits and alternatives for the proposed anesthesia with the patient or authorized representative who has indicated his/her understanding and acceptance.     Dental advisory given  Plan Discussed with: CRNA, Anesthesiologist and Surgeon  Anesthesia Plan Comments:        Anesthesia Quick Evaluation

## 2021-06-14 ENCOUNTER — Encounter (HOSPITAL_COMMUNITY): Payer: Self-pay | Admitting: Neurosurgery

## 2021-06-14 ENCOUNTER — Ambulatory Visit (HOSPITAL_COMMUNITY): Payer: BC Managed Care – PPO | Admitting: Anesthesiology

## 2021-06-14 ENCOUNTER — Ambulatory Visit (HOSPITAL_COMMUNITY): Payer: BC Managed Care – PPO

## 2021-06-14 ENCOUNTER — Encounter (HOSPITAL_COMMUNITY)
Admission: RE | Disposition: A | Discharge status: Home / Self Care | Payer: Self-pay | Source: Ambulatory Visit | Attending: Neurosurgery

## 2021-06-14 ENCOUNTER — Observation Stay (HOSPITAL_COMMUNITY)
Admission: RE | Admit: 2021-06-14 | Discharge: 2021-06-15 | Disposition: A | Discharge status: Home / Self Care | Payer: BC Managed Care – PPO | Source: Ambulatory Visit | Attending: Neurosurgery | Admitting: Neurosurgery

## 2021-06-14 DIAGNOSIS — M4722 Other spondylosis with radiculopathy, cervical region: Secondary | ICD-10-CM | POA: Diagnosis not present

## 2021-06-14 DIAGNOSIS — M5412 Radiculopathy, cervical region: Secondary | ICD-10-CM | POA: Diagnosis present

## 2021-06-14 DIAGNOSIS — M4802 Spinal stenosis, cervical region: Secondary | ICD-10-CM | POA: Insufficient documentation

## 2021-06-14 DIAGNOSIS — M79602 Pain in left arm: Secondary | ICD-10-CM | POA: Diagnosis present

## 2021-06-14 DIAGNOSIS — Z419 Encounter for procedure for purposes other than remedying health state, unspecified: Secondary | ICD-10-CM

## 2021-06-14 HISTORY — PX: ANTERIOR CERVICAL DECOMP/DISCECTOMY FUSION: SHX1161

## 2021-06-14 LAB — POCT PREGNANCY, URINE: Preg Test, Ur: NEGATIVE

## 2021-06-14 SURGERY — ANTERIOR CERVICAL DECOMPRESSION/DISCECTOMY FUSION 2 LEVELS
Anesthesia: General | Site: Neck

## 2021-06-14 MED ORDER — HYDROMORPHONE HCL 1 MG/ML IJ SOLN
INTRAMUSCULAR | Status: AC
  Filled 2021-06-14: qty 1

## 2021-06-14 MED ORDER — LACTATED RINGERS IV SOLN: INTRAVENOUS | Status: DC

## 2021-06-14 MED ORDER — PHENOL 1.4 % MT LIQD
1.0000 | OROMUCOSAL | Status: DC | PRN
  Filled 2021-06-14: qty 177

## 2021-06-14 MED ORDER — DEXAMETHASONE SODIUM PHOSPHATE 10 MG/ML IJ SOLN
INTRAMUSCULAR | Status: AC
  Filled 2021-06-14: qty 1

## 2021-06-14 MED ORDER — PROPOFOL 10 MG/ML IV BOLUS
INTRAVENOUS | Status: AC
  Filled 2021-06-14: qty 20

## 2021-06-14 MED ORDER — ONDANSETRON HCL 4 MG PO TABS: 4.0000 mg | ORAL_TABLET | Freq: Four times a day (QID) | ORAL | Status: DC | PRN

## 2021-06-14 MED ORDER — SODIUM CHLORIDE (PF) 0.9 % IJ SOLN
INTRAMUSCULAR | Status: AC
  Filled 2021-06-14: qty 10

## 2021-06-14 MED ORDER — PROPOFOL 10 MG/ML IV BOLUS
INTRAVENOUS | Status: DC | PRN
  Administered 2021-06-14: 160 mg via INTRAVENOUS

## 2021-06-14 MED ORDER — THROMBIN 5000 UNITS EX SOLR
CUTANEOUS | Status: AC
  Filled 2021-06-14: qty 15000

## 2021-06-14 MED ORDER — HYDROCODONE-ACETAMINOPHEN 5-325 MG PO TABS
1.0000 | ORAL_TABLET | ORAL | Status: DC | PRN
Start: 2021-06-14 — End: 2021-06-15
  Administered 2021-06-15: 1 via ORAL
  Filled 2021-06-14: qty 1

## 2021-06-14 MED ORDER — ACETAMINOPHEN 10 MG/ML IV SOLN
INTRAVENOUS | Status: AC
  Filled 2021-06-14: qty 100

## 2021-06-14 MED ORDER — SUFENTANIL CITRATE 50 MCG/ML IV SOLN
INTRAVENOUS | Status: AC
  Filled 2021-06-14: qty 1

## 2021-06-14 MED ORDER — ACETAMINOPHEN 10 MG/ML IV SOLN
1000.0000 mg | Freq: Four times a day (QID) | INTRAVENOUS | Status: DC
  Administered 2021-06-14: 1000 mg via INTRAVENOUS

## 2021-06-14 MED ORDER — HEMOSTATIC AGENTS (NO CHARGE) OPTIME
TOPICAL | Status: DC | PRN
  Administered 2021-06-14: 1 via TOPICAL

## 2021-06-14 MED ORDER — ACETAMINOPHEN 325 MG PO TABS: 650.0000 mg | ORAL_TABLET | ORAL | Status: DC | PRN

## 2021-06-14 MED ORDER — CHLORHEXIDINE GLUCONATE 0.12 % MT SOLN
15.0000 mL | OROMUCOSAL | Status: AC
  Filled 2021-06-14: qty 15

## 2021-06-14 MED ORDER — CYCLOBENZAPRINE HCL 10 MG PO TABS
ORAL_TABLET | ORAL | Status: AC
  Filled 2021-06-14: qty 1

## 2021-06-14 MED ORDER — HYDROMORPHONE HCL 1 MG/ML IJ SOLN
0.2500 mg | INTRAMUSCULAR | Status: DC | PRN
  Administered 2021-06-14 (×2): 0.25 mg via INTRAVENOUS
  Administered 2021-06-14 (×3): 0.5 mg via INTRAVENOUS

## 2021-06-14 MED ORDER — MIDAZOLAM HCL 2 MG/2ML IJ SOLN
INTRAMUSCULAR | Status: DC | PRN
  Administered 2021-06-14: 2 mg via INTRAVENOUS

## 2021-06-14 MED ORDER — LIDOCAINE 2% (20 MG/ML) 5 ML SYRINGE
INTRAMUSCULAR | Status: DC | PRN
  Administered 2021-06-14: 50 mg via INTRAVENOUS

## 2021-06-14 MED ORDER — OXYCODONE HCL 5 MG PO TABS
ORAL_TABLET | ORAL | Status: AC
  Filled 2021-06-14: qty 1

## 2021-06-14 MED ORDER — ONDANSETRON HCL 4 MG/2ML IJ SOLN: 4.0000 mg | Freq: Once | INTRAMUSCULAR | Status: DC | PRN

## 2021-06-14 MED ORDER — HYDROCODONE-ACETAMINOPHEN 10-325 MG PO TABS
2.0000 | ORAL_TABLET | ORAL | Status: DC | PRN
  Administered 2021-06-14 – 2021-06-15 (×5): 2 via ORAL
  Filled 2021-06-14 (×5): qty 2

## 2021-06-14 MED ORDER — CHLORHEXIDINE GLUCONATE 0.12 % MT SOLN
OROMUCOSAL | Status: AC
  Administered 2021-06-14: 15 mL via OROMUCOSAL
  Filled 2021-06-14: qty 15

## 2021-06-14 MED ORDER — CHLORHEXIDINE GLUCONATE CLOTH 2 % EX PADS: 6.0000 | MEDICATED_PAD | Freq: Once | CUTANEOUS | Status: DC

## 2021-06-14 MED ORDER — CEFAZOLIN SODIUM-DEXTROSE 2-4 GM/100ML-% IV SOLN
2.0000 g | INTRAVENOUS | Status: AC
Start: 2021-06-14 — End: 2021-06-14
  Administered 2021-06-14: 2 g via INTRAVENOUS
  Filled 2021-06-14: qty 100

## 2021-06-14 MED ORDER — 0.9 % SODIUM CHLORIDE (POUR BTL) OPTIME
TOPICAL | Status: DC | PRN
  Administered 2021-06-14: 1000 mL

## 2021-06-14 MED ORDER — SODIUM CHLORIDE 0.9% FLUSH
3.0000 mL | Freq: Two times a day (BID) | INTRAVENOUS | Status: DC
  Administered 2021-06-14 (×2): 3 mL via INTRAVENOUS

## 2021-06-14 MED ORDER — MIDAZOLAM HCL 2 MG/2ML IJ SOLN
INTRAMUSCULAR | Status: AC
  Filled 2021-06-14: qty 2

## 2021-06-14 MED ORDER — HYDROMORPHONE HCL 1 MG/ML IJ SOLN: 1.0000 mg | INTRAMUSCULAR | Status: DC | PRN

## 2021-06-14 MED ORDER — OXYCODONE HCL 5 MG/5ML PO SOLN: 5.0000 mg | Freq: Once | ORAL | Status: AC | PRN

## 2021-06-14 MED ORDER — SODIUM CHLORIDE 0.9 % IV SOLN: 250.0000 mL | INTRAVENOUS | Status: DC

## 2021-06-14 MED ORDER — MENTHOL 3 MG MT LOZG
1.0000 | LOZENGE | OROMUCOSAL | Status: DC | PRN
  Filled 2021-06-14: qty 9

## 2021-06-14 MED ORDER — OXYCODONE HCL 5 MG PO TABS
5.0000 mg | ORAL_TABLET | Freq: Once | ORAL | Status: AC | PRN
  Administered 2021-06-14: 5 mg via ORAL

## 2021-06-14 MED ORDER — ONDANSETRON HCL 4 MG/2ML IJ SOLN
INTRAMUSCULAR | Status: AC
  Filled 2021-06-14: qty 2

## 2021-06-14 MED ORDER — ONDANSETRON HCL 4 MG/2ML IJ SOLN: 4.0000 mg | Freq: Four times a day (QID) | INTRAMUSCULAR | Status: DC | PRN

## 2021-06-14 MED ORDER — CEFAZOLIN SODIUM-DEXTROSE 1-4 GM/50ML-% IV SOLN
1.0000 g | Freq: Three times a day (TID) | INTRAVENOUS | Status: AC
  Administered 2021-06-14 (×2): 1 g via INTRAVENOUS
  Filled 2021-06-14 (×2): qty 50

## 2021-06-14 MED ORDER — LIDOCAINE 2% (20 MG/ML) 5 ML SYRINGE
INTRAMUSCULAR | Status: AC
  Filled 2021-06-14: qty 5

## 2021-06-14 MED ORDER — DEXAMETHASONE SODIUM PHOSPHATE 10 MG/ML IJ SOLN
10.0000 mg | Freq: Once | INTRAMUSCULAR | Status: AC
  Administered 2021-06-14: 10 mg via INTRAVENOUS

## 2021-06-14 MED ORDER — ROCURONIUM BROMIDE 10 MG/ML (PF) SYRINGE
PREFILLED_SYRINGE | INTRAVENOUS | Status: AC
  Filled 2021-06-14: qty 10

## 2021-06-14 MED ORDER — ACETAMINOPHEN 650 MG RE SUPP: 650.0000 mg | RECTAL | Status: DC | PRN

## 2021-06-14 MED ORDER — THROMBIN 5000 UNITS EX SOLR
CUTANEOUS | Status: DC | PRN
  Administered 2021-06-14 (×2): 5000 [IU] via TOPICAL

## 2021-06-14 MED ORDER — SODIUM CHLORIDE 0.9% FLUSH: 3.0000 mL | INTRAVENOUS | Status: DC | PRN

## 2021-06-14 MED ORDER — ROCURONIUM BROMIDE 10 MG/ML (PF) SYRINGE
PREFILLED_SYRINGE | INTRAVENOUS | Status: DC | PRN
  Administered 2021-06-14: 70 mg via INTRAVENOUS

## 2021-06-14 MED ORDER — CYCLOBENZAPRINE HCL 10 MG PO TABS
10.0000 mg | ORAL_TABLET | Freq: Three times a day (TID) | ORAL | Status: DC | PRN
  Administered 2021-06-14 (×2): 10 mg via ORAL
  Filled 2021-06-14: qty 1

## 2021-06-14 MED ORDER — SCOPOLAMINE 1 MG/3DAYS TD PT72
MEDICATED_PATCH | TRANSDERMAL | Status: DC | PRN
  Administered 2021-06-14: 1 via TRANSDERMAL

## 2021-06-14 MED ORDER — ONDANSETRON HCL 4 MG/2ML IJ SOLN
INTRAMUSCULAR | Status: DC | PRN
  Administered 2021-06-14: 4 mg via INTRAVENOUS

## 2021-06-14 MED ORDER — SUFENTANIL CITRATE 50 MCG/ML IV SOLN
INTRAVENOUS | Status: DC | PRN
  Administered 2021-06-14: 10 ug via INTRAVENOUS
  Administered 2021-06-14: 15 ug via INTRAVENOUS
  Administered 2021-06-14: 5 ug via INTRAVENOUS
  Administered 2021-06-14 (×2): 10 ug via INTRAVENOUS

## 2021-06-14 SURGICAL SUPPLY — 61 items
APL SKNCLS STERI-STRIP NONHPOA (GAUZE/BANDAGES/DRESSINGS) ×1
BAG COUNTER SPONGE SURGICOUNT (BAG) ×3 IMPLANT
BAG DECANTER FOR FLEXI CONT (MISCELLANEOUS) ×1 IMPLANT
BAG SPNG CNTER NS LX DISP (BAG) ×2
BAND INSRT 18 STRL LF DISP RB (MISCELLANEOUS) ×2
BAND RUBBER #18 3X1/16 STRL (MISCELLANEOUS) ×4 IMPLANT
BENZOIN TINCTURE PRP APPL 2/3 (GAUZE/BANDAGES/DRESSINGS) ×2 IMPLANT
BIT DRILL 13 (BIT) ×1 IMPLANT
BUR MATCHSTICK NEURO 3.0 LAGG (BURR) ×2 IMPLANT
CAGE PEEK 6X14X11 (Cage) ×4 IMPLANT
CANISTER SUCT 3000ML PPV (MISCELLANEOUS) ×2 IMPLANT
CARTRIDGE OIL MAESTRO DRILL (MISCELLANEOUS) ×1 IMPLANT
DIFFUSER DRILL AIR PNEUMATIC (MISCELLANEOUS) ×2 IMPLANT
DRAPE C-ARM 42X72 X-RAY (DRAPES) ×4 IMPLANT
DRAPE LAPAROTOMY 100X72 PEDS (DRAPES) ×2 IMPLANT
DRAPE MICROSCOPE LEICA (MISCELLANEOUS) ×2 IMPLANT
DURAPREP 6ML APPLICATOR 50/CS (WOUND CARE) ×2 IMPLANT
ELECT COATED BLADE 2.86 ST (ELECTRODE) ×2 IMPLANT
ELECT REM PT RETURN 9FT ADLT (ELECTROSURGICAL) ×2
ELECTRODE REM PT RTRN 9FT ADLT (ELECTROSURGICAL) ×1 IMPLANT
GAUZE 4X4 16PLY ~~LOC~~+RFID DBL (SPONGE) ×1 IMPLANT
GAUZE SPONGE 4X4 12PLY STRL (GAUZE/BANDAGES/DRESSINGS) ×1 IMPLANT
GAUZE SPONGE 4X4 12PLY STRL LF (GAUZE/BANDAGES/DRESSINGS) ×1 IMPLANT
GLOVE EXAM NITRILE XL STR (GLOVE) IMPLANT
GLOVE SURG LTX SZ9 (GLOVE) ×2 IMPLANT
GLOVE SURG POLYISO LF SZ6 (GLOVE) ×1 IMPLANT
GLOVE SURG POLYISO LF SZ6.5 (GLOVE) ×4 IMPLANT
GLOVE SURG UNDER POLY LF SZ6.5 (GLOVE) ×3 IMPLANT
GLOVE SURG UNDER POLY LF SZ7.5 (GLOVE) ×2 IMPLANT
GOWN STRL REUS W/ TWL LRG LVL3 (GOWN DISPOSABLE) IMPLANT
GOWN STRL REUS W/ TWL XL LVL3 (GOWN DISPOSABLE) ×1 IMPLANT
GOWN STRL REUS W/TWL 2XL LVL3 (GOWN DISPOSABLE) IMPLANT
GOWN STRL REUS W/TWL LRG LVL3 (GOWN DISPOSABLE) ×8
GOWN STRL REUS W/TWL XL LVL3 (GOWN DISPOSABLE) ×2
HALTER HD/CHIN CERV TRACTION D (MISCELLANEOUS) ×2 IMPLANT
HEMOSTAT POWDER KIT SURGIFOAM (HEMOSTASIS) IMPLANT
HEMOSTAT SURGICEL 2X14 (HEMOSTASIS) IMPLANT
KIT BASIN OR (CUSTOM PROCEDURE TRAY) ×2 IMPLANT
KIT TURNOVER KIT B (KITS) ×2 IMPLANT
NDL SPNL 20GX3.5 QUINCKE YW (NEEDLE) ×1 IMPLANT
NEEDLE SPNL 20GX3.5 QUINCKE YW (NEEDLE) ×2 IMPLANT
NS IRRIG 1000ML POUR BTL (IV SOLUTION) ×2 IMPLANT
OIL CARTRIDGE MAESTRO DRILL (MISCELLANEOUS) ×2
PACK LAMINECTOMY NEURO (CUSTOM PROCEDURE TRAY) ×2 IMPLANT
PAD ARMBOARD 7.5X6 YLW CONV (MISCELLANEOUS) ×6 IMPLANT
PLATE ELITE 42MM (Plate) ×1 IMPLANT
SCREW ST 13X4XST VA NS SPNE (Screw) IMPLANT
SCREW ST VAR 4 ATL (Screw) ×12 IMPLANT
SPACER SPNL 11X14X6XPEEK CVD (Cage) IMPLANT
SPCR SPNL 11X14X6XPEEK CVD (Cage) ×2 IMPLANT
SPONGE INTESTINAL PEANUT (DISPOSABLE) ×2 IMPLANT
SPONGE SURGIFOAM ABS GEL SZ50 (HEMOSTASIS) ×2 IMPLANT
STRIP CLOSURE SKIN 1/2X4 (GAUZE/BANDAGES/DRESSINGS) ×2 IMPLANT
SUT VIC AB 3-0 SH 8-18 (SUTURE) ×2 IMPLANT
SUT VIC AB 4-0 RB1 18 (SUTURE) ×2 IMPLANT
TAPE CLOTH 4X10 WHT NS (GAUZE/BANDAGES/DRESSINGS) ×2 IMPLANT
TAPE CLOTH SURG 4X10 WHT LF (GAUZE/BANDAGES/DRESSINGS) ×1 IMPLANT
TOWEL GREEN STERILE (TOWEL DISPOSABLE) ×2 IMPLANT
TOWEL GREEN STERILE FF (TOWEL DISPOSABLE) ×2 IMPLANT
TRAP SPECIMEN MUCUS 40CC (MISCELLANEOUS) ×2 IMPLANT
WATER STERILE IRR 1000ML POUR (IV SOLUTION) ×2 IMPLANT

## 2021-06-14 NOTE — Progress Notes (Signed)
Orthopedic Tech Progress Note Patient Details:  Tiffany Ho April 30, 1981 [id number]  Patient has on COLLAR   Patient ID: Tiffany Ho, female   DOB: 1981-01-16, 40 y.o.   MRN: [id number]  [name] 06/14/2021, 12:14 PM

## 2021-06-14 NOTE — Transfer of Care (Signed)
Immediate Anesthesia Transfer of Care Note  Patient: Tiffany Ho  Procedure(s) Performed: Anterior Cervical Decompression Discectomy Fusion Cervical four-five, Cervical five-six (Neck)  Patient Location: PACU  Anesthesia Type:General  Level of Consciousness: drowsy and patient cooperative  Airway & Oxygen Therapy: Patient Spontanous Breathing and Patient connected to nasal cannula oxygen  Post-op Assessment: Report given to RN and Post -op Vital signs reviewed and stable  Post vital signs: Reviewed and stable  Last Vitals:  Vitals Value Taken Time  BP 150/113 06/14/21 1000  Temp    Pulse 84 06/14/21 1000  Resp    SpO2 100 % 06/14/21 1000  Vitals shown include unvalidated device data.  Last Pain:  Vitals:   06/14/21 0644  TempSrc:   PainSc: 7       Patients Stated Pain Goal: 5 ([id number])  Complications: No notable events documented.

## 2021-06-14 NOTE — Anesthesia Procedure Notes (Signed)
Procedure Name: Intubation Date/Time: 06/14/2021 8:14 AM Performed by: Moshe Salisbury, CRNA Pre-anesthesia Checklist: Patient identified, Emergency Drugs available, Suction available and Patient being monitored Patient Re-evaluated:Patient Re-evaluated prior to induction Oxygen Delivery Method: Circle System Utilized Preoxygenation: Pre-oxygenation with 100% oxygen Induction Type: IV induction Ventilation: Mask ventilation without difficulty Laryngoscope Size: Glidescope Tube type: Oral Tube size: 7.0 mm Number of attempts: 1 Airway Equipment and Method: Rigid stylet and Video-laryngoscopy Placement Confirmation: ETT inserted through vocal cords under direct vision, positive ETCO2 and breath sounds checked- equal and bilateral Secured at: 21 cm Tube secured with: Tape Dental Injury: Teeth and Oropharynx as per pre-operative assessment

## 2021-06-14 NOTE — Brief Op Note (Signed)
06/14/2021  9:51 AM  PATIENT:  Tiffany Ho  40 y.o. female  PRE-OPERATIVE DIAGNOSIS:  Stenosis  POST-OPERATIVE DIAGNOSIS:  Stenosis  PROCEDURE:  Procedure(s): Anterior Cervical Decompression Discectomy Fusion Cervical four-five, Cervical five-six (N/A)  SURGEON:  Surgeon(s) and Role:    Earnie Larsson, MD - Primary  PHYSICIAN ASSISTANT:   ASSISTANTSMearl Latin   ANESTHESIA:   general  EBL:  50 mL   BLOOD ADMINISTERED:none  DRAINS: none   LOCAL MEDICATIONS USED:  NONE  SPECIMEN:  No Specimen  DISPOSITION OF SPECIMEN:  N/A  COUNTS:  YES  TOURNIQUET:  * No tourniquets in log *  DICTATION: .Dragon Dictation  PLAN OF CARE: Admit for overnight observation  PATIENT DISPOSITION:  PACU - hemodynamically stable.   Delay start of Pharmacological VTE agent (>24hrs) due to surgical blood loss or risk of bleeding: yes

## 2021-06-14 NOTE — Anesthesia Postprocedure Evaluation (Signed)
Anesthesia Post Note  Patient: Tiffany Ho  Procedure(s) Performed: Anterior Cervical Decompression Discectomy Fusion Cervical four-five, Cervical five-six (Neck)     Patient location during evaluation: PACU Anesthesia Type: General Level of consciousness: awake and alert and oriented Pain management: pain level controlled Vital Signs Assessment: post-procedure vital signs reviewed and stable Respiratory status: spontaneous breathing, nonlabored ventilation and respiratory function stable Cardiovascular status: blood pressure returned to baseline and stable Postop Assessment: no apparent nausea or vomiting Anesthetic complications: no   No notable events documented.  Last Vitals:  Vitals:   06/14/21 1030 06/14/21 1045  BP: 108/74 (!) 112/58  Pulse: 74 70  Resp: 18 16  Temp:  36.9 C  SpO2: 97% 93%    Last Pain:  Vitals:   06/14/21 1040  TempSrc:   PainSc: Asleep                 Rockey Guarino A.

## 2021-06-14 NOTE — H&P (Signed)
  Tiffany Ho is an 40 y.o. female.   Chief Complaint: Left arm pain HPI: 40 year old female with neck and left upper extremity pain with associated numbness and tingling.  Work-up demonstrates evidence of significant degenerative changes with early stenosis at C4-5 and marked spondylosis with a leftward disc herniation at C5-6 causing severe compression of her left C6 nerve root.  Patient is failed conservative management presents now for two-level anterior cervical decompression and fusion in hopes improving her symptoms.  Past Medical History:  Diagnosis Date   Anxiety    Carpal tunnel syndrome, bilateral    Constipation    Gallstones    Panic attacks    Seasonal allergies     Past Surgical History:  Procedure Laterality Date   CESAREAN SECTION     CHOLECYSTECTOMY  07/29/2012   Procedure: LAPAROSCOPIC CHOLECYSTECTOMY WITH INTRAOPERATIVE CHOLANGIOGRAM;  Surgeon: Adin Hector, MD;  Location: [place];  Service: General;  Laterality: N/A;   neck pain     WISDOM TOOTH EXTRACTION  2016    History reviewed. No pertinent family history. Social History:  reports that she has never smoked. She has never used smokeless tobacco. She reports that she does not drink alcohol and does not use drugs.  Allergies: No Active Allergies  Medications Prior to Admission  Medication Sig Dispense Refill   NEURONTIN 300 MG capsule Take 300 mg by mouth in the morning, at noon, and at bedtime.      Results for orders placed or performed during the hospital encounter of 06/14/21 (from the past 48 hour(s))  Pregnancy, urine POC     Status: None   Collection Time: 06/14/21  6:57 AM  Result Value Ref Range   Preg Test, Ur NEGATIVE NEGATIVE    Comment:        THE SENSITIVITY OF THIS METHODOLOGY IS >24 mIU/mL    No results found.  Pertinent items noted in HPI and remainder of comprehensive ROS otherwise negative.  Blood pressure 130/84, pulse 84, temperature 98 F (36.7 C), temperature  source Oral, resp. rate 18, height '5\' 4"'$  (1.626 m), weight 91.2 kg, SpO2 98 %.  Patient is awake and alert.  She is oriented and appropriate.  Speech is fluent.  Judgment insight are intact.  Cranial nerve function normal bilateral.  Motor examination reveals some mild weakness of wrist extensors on the left otherwise motor strength intact.  Sensory examination with decrease sensation pinprick light touch in her left C6 dermatome.  Deep tender versus normal active.  No evidence of long track signs.  Gait and posture normal.  Examination head ears eyes nose throat is unremarkable her chest and abdomen benign.  Neck is benign.  Extremities are free of major deformity.. 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  Plan C4-5, C5-6 anterior cervical discectomy with interbody fusion utilizing interbody cages, local harvested autograft, and anterior plate is rotation.  Risks and benefits of been explained.  Patient wishes to proceed.  Mallie Mussel A Ellington [place]ade 06/14/2021, 7:52 AM

## 2021-06-14 NOTE — Op Note (Signed)
Date of procedure: 06/13/2021  Date of dictation: Same  Service: Neurosurgery  Preoperative diagnosis: C4-5, C5-6 spondylosis with radiculopathy  Postoperative diagnosis: Same  Procedure Name: C4-5, C5-6 anterior cervical discectomy with interbody fusion utilizing interbody cages, local harvested autograft, and anterior plate instrumentation  Surgeon:Noah Pelaez A.Shakyla Nolley, M.D.  Asst. Surgeon: Reinaldo Meeker, NP  Anesthesia: General  Indication: 40 year old female with neck and left upper extremity pain sensory loss and some degree of weakness which is failed conservative management her work-up demonstrates evidence of disc generation with associated spondylosis at C4-5 and C5-6 with severe left-sided C6 nerve root compression.  Patient presents now for decompression and fusion in hopes improving her symptoms.  Operative note: After induction anesthesia, patient positioned supine with neck slightly extended and held placed halter traction.  Patient's anterior cervical region prepped draped sterilely.  Incision made overlying C5.  Dissection performed on the right.  Retractor placed.  Fluoroscopy used.  Levels confirmed.  Disc spaces at C4-5 and C5-6 were incised.  Discectomy was then performed using various instruments down to level of the posterior annulus.  Microscope was then brought to field used throughout the remainder of the discectomy remaining aspects of annulus and osteophytes removed and high-speed drill down to the level of the posterior logical ligament.  Posterior logical is not elevated and resected piecemeal fashion underlying thecal sac was identified.  Wide central decompression then performed undercutting the bodies of C4 and C5.  Decompression then proceeded external foramina.  Wide anterior foraminotomies were performed along course exiting along the C5 nerve roots bilaterally.  At this point a very thorough decompression achieved.  There is no evidence of injury to thecal sac or nerve roots.   Procedures then repeated at C5-6 with particular attention to the left-sided C6 foramen.  Wound was then irrigated.  Gelfoam was placed topically then removed.  [employer] anatomic peek cages were then impacted into place.  Each cage was filled with morselized autograft.  Each cage was recessed slightly from the anterior cortical margin.  42 mm Atlantis anterior cervical plate was then placed over the C4-5 and 6 levels.  This attached under fluoroscopic guidance using 13 mm variable angle screws to each at all 3 levels.  All screws given final tightening found to be solid within the bone.  Locking screws engaged all levels.  Final images reveal good position of the cages and the hardware at the proper operative levels with normal alignment of spine.  Wound was then inspected hemostasis.  Wound is then closed in layers with Vicryl sutures.  Steri-Strips and sterile dressing were applied.  No apparent complications.  Patient tolerated the procedure well and she returns to recovery and postop.

## 2021-06-15 ENCOUNTER — Encounter (HOSPITAL_COMMUNITY): Payer: Self-pay | Admitting: Neurosurgery

## 2021-06-15 DIAGNOSIS — M4722 Other spondylosis with radiculopathy, cervical region: Secondary | ICD-10-CM | POA: Diagnosis not present

## 2021-06-15 MED ORDER — HYDROCODONE-ACETAMINOPHEN 5-325 MG PO TABS: 1.0000 | ORAL_TABLET | ORAL | 0 refills | Status: DC | PRN

## 2021-06-15 MED ORDER — CYCLOBENZAPRINE HCL 10 MG PO TABS: 10.0000 mg | ORAL_TABLET | Freq: Three times a day (TID) | ORAL | 0 refills | Status: DC | PRN

## 2021-06-15 NOTE — Discharge Instructions (Signed)

## 2021-06-15 NOTE — Evaluation (Signed)
Occupational Therapy Evaluation Patient Details Name: Tiffany Ho MRN: [id number] DOB: 12/28/1980 Today's Date: 06/15/2021    History of Present Illness 40 y.o. female presenting for elective 2 level ACDF seconary to C4-5 and C5-6 spondylosis with stenosis and radiculopathy. PMHx significant for anxiety and bilateral carpal tunnel.   Clinical Impression   PTA patient was living with her 3 sons in a private residence and was independent with ADLs/IADLs without AD. Patient currently presents near baseline level of function demonstrating observed ADLs with Mod I to supervision A for adherence after education on cervical neck precautions and activity pacing. OT also provided written and verbal education on home set-up to maximize safety and independence with self-care tasks and acquisition/use of AE. Patient expressed verbal understanding. Patient does not require continued acute occupational therapy services with OT to sign off at this time.      Follow Up Recommendations  No OT follow up    Equipment Recommendations  None recommended by OT    Recommendations for Other Services       Precautions / Restrictions Precautions Precautions: Cervical Precaution Booklet Issued: Yes (comment) Precaution Comments: Patient able to recall 3/3 cervical precautions; Min cues for adherence and pacing during ADLs. Required Braces or Orthoses: Cervical Brace Cervical Brace: Soft collar Restrictions Weight Bearing Restrictions: No      Mobility Bed Mobility Overal bed mobility: Needs Assistance Bed Mobility: Rolling;Sidelying to Sit Rolling: Supervision Sidelying to sit: Supervision       General bed mobility comments: Cues for log rolling technique and for pacing.    Transfers Overall transfer level: Modified independent                    Balance Overall balance assessment: No apparent balance deficits (not formally assessed)                                          ADL either performed or assessed with clinical judgement   ADL Overall ADL's : Modified independent                                             Vision         Perception     Praxis      Pertinent Vitals/Pain Pain Assessment: 0-10 Pain Score: 6  Pain Location: R shoulder and anterior cervical neck (incisional) Pain Descriptors / Indicators: Burning;Aching;Discomfort Pain Intervention(s): Limited activity within patient's tolerance;Monitored during session;Premedicated before session;Repositioned     Hand Dominance Right   Extremity/Trunk Assessment Upper Extremity Assessment Upper Extremity Assessment: RUE deficits/detail;LUE deficits/detail RUE Deficits / Details: AROM WFL; only assessed up to 90 degrees active shoulder flexion; did not assess MMT 2/2 cervical precautions. Endorses difficulty with hand strength prior to surgery; able to squeeze washcloth this date without difficulty. RUE Sensation: WNL RUE Coordination: decreased fine motor;decreased gross motor LUE Deficits / Details: AROM WFL; only assessed up to 90 degrees active shoulder flexion; did not assess MMT 2/2 cervical precautions LUE Sensation: WNL LUE Coordination: decreased fine motor;decreased gross motor   Lower Extremity Assessment Lower Extremity Assessment: Overall WFL for tasks assessed   Cervical / Trunk Assessment Cervical / Trunk Assessment: Other exceptions Cervical / Trunk Exceptions: s/p cervical surgery   Communication Communication Communication: No difficulties  Cognition Arousal/Alertness: Awake/alert Behavior During Therapy: WFL for tasks assessed/performed Overall Cognitive Status: Within Functional Limits for tasks assessed                                     General Comments  Clean, dry dressing at incision    Exercises     Shoulder Instructions      Home Living Family/patient expects to be discharged to:: Private  residence Living Arrangements: Children;Other (Comment) (3 sons ages 64, 56 and 60) Available Help at Discharge: Family Type of Home: Mobile home Home Access: Stairs to enter Entrance Stairs-Number of Steps: 3 +2 Entrance Stairs-Rails: Right;Left;Can reach both Home Layout: One level     Bathroom Shower/Tub: Tub/shower unit;Walk-in shower   Bathroom Toilet: Standard     Home Equipment: Environmental consultant - 2 wheels;Walker - 4 wheels;Bedside commode (Equipment left over from her mother)          Prior Functioning/Environment Level of Independence: Independent        Comments: I with ADLs/IADLs at baseline; works as a Counsellor Problem List:        OT Treatment/Interventions:      OT Goals(Current goals can be found in the care plan section) Acute Rehab OT Goals Patient Stated Goal: To return home. OT Goal Formulation: With patient  OT Frequency:     Barriers to D/C:            Co-evaluation              AM-PAC OT "6 Clicks" Daily Activity     Outcome Measure Help from another person eating meals?: None Help from another person taking care of personal grooming?: None Help from another person toileting, which includes using toliet, bedpan, or urinal?: None Help from another person bathing (including washing, rinsing, drying)?: None Help from another person to put on and taking off regular upper body clothing?: None Help from another person to put on and taking off regular lower body clothing?: None 6 Click Score: 24   End of Session Equipment Utilized During Treatment: Cervical collar Nurse Communication: Mobility status;Other (comment) (Response to treatment)  Activity Tolerance: Patient tolerated treatment well Patient left: in chair;with call bell/phone within reach  OT Visit Diagnosis: Muscle weakness (generalized) (M62.81)                Time: [id number] OT Time Calculation (min): 18 min Charges:  OT General Charges $OT Visit: 1 Visit OT  Evaluation $OT Eval Low Complexity: 1 Low  Barret Esquivel H. OTR/L Supplemental OT, Department of rehab services [phone]  Rilley Poulter R H. 06/15/2021, 8:39 AM

## 2021-06-15 NOTE — Discharge Summary (Signed)
Physician Discharge Summary  Patient ID: Tiffany Ho MRN: [id number] DOB/AGE: [birth year] 41 y.o.  Admit date: 06/14/2021 Discharge date: 06/15/2021  Admission Diagnoses:  Discharge Diagnoses:  Active Problems:   Cervical radiculopathy   Discharged Condition: good  Hospital Course: Patient been in the hospital where he underwent uncomplicated two-level anterior cervical decompression and fusion.  Postoperatively doing reasonably well.  Complains of incisional pain and some neck soreness.  No upper extremity symptoms.  Standing ambulating and voiding without difficulty.  Ready for discharge home.  Consults:   Significant Diagnostic Studies:   Treatments:   Discharge Exam: Blood pressure 100/66, pulse (!) 59, temperature 98.2 F (36.8 C), temperature source Oral, resp. rate 18, height '5\' 4"'$  (1.626 m), weight 91.2 kg, SpO2 96 %. Awake and alert.  Oriented and appropriate.  Motor and sensory function intact.  Wound clean dry and intact.  Chest and abdomen benign.  Neck soft.  Airway patent.  Disposition: Discharge disposition: 01-Home or Self Care        Allergies as of 06/15/2021   No Active Allergies      Medication List     TAKE these medications    cyclobenzaprine 10 MG tablet Commonly known as: FLEXERIL Take 1 tablet (10 mg total) by mouth 3 (three) times daily as needed for muscle spasms.   HYDROcodone-acetaminophen 5-325 MG tablet Commonly known as: NORCO/VICODIN Take 1 tablet by mouth every 4 (four) hours as needed for moderate pain ((score 4 to 6)).   Neurontin 300 MG capsule Generic drug: gabapentin Take 300 mg by mouth in the morning, at noon, and at bedtime.         SignedCooper Render Shunsuke Granzow 06/15/2021, 9:03 AM

## 2021-06-15 NOTE — Progress Notes (Signed)
Patient was transported via wheelchair by volunteer for discharge home; in no acute distress nor complaints of pain nor discomfort; room was checked and accounted for all her belongings; discharge instructions given to patient by RN and she verbalized understanding on the instructions given.

## 2021-11-17 ENCOUNTER — Emergency Department (HOSPITAL_COMMUNITY): Payer: Medicaid Other

## 2021-11-17 ENCOUNTER — Encounter (HOSPITAL_COMMUNITY): Payer: Self-pay | Admitting: *Deleted

## 2021-11-17 ENCOUNTER — Emergency Department (HOSPITAL_COMMUNITY)
Admission: EM | Admit: 2021-11-17 | Discharge: 2021-11-17 | Disposition: A | Discharge status: Home / Self Care | Payer: Medicaid Other | Attending: Emergency Medicine | Admitting: Emergency Medicine

## 2021-11-17 DIAGNOSIS — R519 Headache, unspecified: Secondary | ICD-10-CM | POA: Insufficient documentation

## 2021-11-17 DIAGNOSIS — M542 Cervicalgia: Secondary | ICD-10-CM | POA: Diagnosis present

## 2021-11-17 DIAGNOSIS — M436 Torticollis: Secondary | ICD-10-CM | POA: Diagnosis not present

## 2021-11-17 DIAGNOSIS — R000 Tachycardia, unspecified: Secondary | ICD-10-CM | POA: Diagnosis not present

## 2021-11-17 MED ORDER — HYDROMORPHONE HCL 1 MG/ML IJ SOLN
1.0000 mg | Freq: Once | INTRAMUSCULAR | Status: AC
  Administered 2021-11-17: 1 mg via INTRAVENOUS
  Filled 2021-11-17: qty 1

## 2021-11-17 MED ORDER — ONDANSETRON HCL 4 MG/2ML IJ SOLN
4.0000 mg | Freq: Once | INTRAMUSCULAR | Status: AC
Start: 2021-11-17 — End: 2021-11-17
  Administered 2021-11-17: 4 mg via INTRAVENOUS
  Filled 2021-11-17: qty 2

## 2021-11-17 MED ORDER — DIAZEPAM 5 MG/ML IJ SOLN
2.5000 mg | Freq: Once | INTRAMUSCULAR | Status: AC
  Administered 2021-11-17: 2.5 mg via INTRAVENOUS
  Filled 2021-11-17: qty 2

## 2021-11-17 MED ORDER — OXYCODONE-ACETAMINOPHEN 5-325 MG PO TABS: 1.0000 | ORAL_TABLET | ORAL | 0 refills | Status: DC | PRN

## 2021-11-17 MED ORDER — METHOCARBAMOL 500 MG PO TABS: 500.0000 mg | ORAL_TABLET | Freq: Three times a day (TID) | ORAL | 0 refills | Status: DC

## 2021-11-17 NOTE — ED Provider Notes (Signed)
[facility] Provider Note   CSN: [id number] Arrival date & time: 11/17/21  1341     History  Chief Complaint  Patient presents with   Neck Pain    Tiffany Ho is a 41 y.o. female.   Neck Pain Associated symptoms: headaches   Associated symptoms: no chest pain, no fever, no numbness and no weakness      Tiffany Ho is a 41 y.o. female who underwent anterior cervical decompression discectomy fusion August 2022 who presents to the Emergency Department complaining of sudden onset of right-sided neck pain and pain with abduction from her shoulder.  She states that she was lying on the couch with her head propped up on several pillows and attempted to turn over when she felt and heard a "pop" to her neck.  She has been experiencing constant sharp pain to her right neck, top of her shoulder and clavicle area since the pop occurred.  States that she cannot use her arm from her shoulder level return her head to the right.  She feels when she attempts to do so that she has metal popping in her neck.  She denies any numbness or tingling of her hand or arm.  No visual changes, dizziness nausea or vomiting, fever or chills.  She states that she had a brief episode of syncope when the pain occurred.  Episode lasted several seconds.  She states she has been experiencing some muscle spasms and tightening of her neck for several days.  Denies known injury.    Home Medications Prior to Admission medications   Medication Sig Start Date End Date Taking? Authorizing Provider  cyclobenzaprine (FLEXERIL) 10 MG tablet Take 1 tablet (10 mg total) by mouth 3 (three) times daily as needed for muscle spasms. 06/15/21   Earnie Larsson, MD  HYDROcodone-acetaminophen (NORCO/VICODIN) 5-325 MG tablet Take 1 tablet by mouth every 4 (four) hours as needed for moderate pain ((score 4 to 6)). 06/15/21   Earnie Larsson, MD  NEURONTIN 300 MG capsule Take 300 mg by mouth in the morning, at noon,  and at bedtime. 05/02/21   [provider]      Allergies    Patient has no active allergies.    Review of Systems   Review of Systems  Constitutional:  Negative for chills and fever.  Eyes:  Negative for visual disturbance.  Respiratory:  Negative for shortness of breath.   Cardiovascular:  Negative for chest pain.  Gastrointestinal:  Negative for nausea and vomiting.  Musculoskeletal:  Positive for neck pain.  Neurological:  Positive for syncope and headaches. Negative for weakness and numbness.  All other systems reviewed and are negative.  Physical Exam Updated Vital Signs BP (!) 147/101    Pulse (!) 105    Temp 98.1 F (36.7 C)    Resp (!) 24    SpO2 95%  Physical Exam Vitals and nursing note reviewed.  Constitutional:      General: She is not in acute distress.    Appearance: Normal appearance. She is not toxic-appearing.     Comments: Patient is tearful and appears very anxious  HENT:     Mouth/Throat:     Mouth: Mucous membranes are moist.  Eyes:     Extraocular Movements: Extraocular movements intact.     Conjunctiva/sclera: Conjunctivae normal.     Pupils: Pupils are equal, round, and reactive to light.  Neck:     Comments: C-collar applied in triage Cardiovascular:  Rate and Rhythm: Regular rhythm. Tachycardia present.     Pulses: Normal pulses.  Pulmonary:     Effort: Pulmonary effort is normal. No respiratory distress.  Chest:     Chest wall: No tenderness.  Musculoskeletal:        General: Tenderness present.     Cervical back: Tenderness present.     Right lower leg: No edema.     Left lower leg: No edema.     Comments: Diffuse ttp of the right cervical paraspinal and trapezius muscles.  Grips strength is strong and symmetrical bilaterally.  Pain with attempted abduction of the right arm.    Skin:    General: Skin is warm.     Capillary Refill: Capillary refill takes less than 2 seconds.     Findings: No rash.  Neurological:     General:  No focal deficit present.     Mental Status: She is alert.     GCS: GCS eye subscore is 4. GCS verbal subscore is 5. GCS motor subscore is 6.     Sensory: Sensation is intact. No sensory deficit.     Motor: Motor function is intact. No weakness or pronator drift.    ED Results / Procedures / Treatments   Labs (all labs ordered are listed, but only abnormal results are displayed) Labs Reviewed - No data to display  EKG None  Radiology CT Cervical Spine Wo Contrast  Result Date: 11/17/2021 CLINICAL DATA:  Neck pain, acute, prior cervical surgery EXAM: CT CERVICAL SPINE WITHOUT CONTRAST TECHNIQUE: Multidetector CT imaging of the cervical spine was performed without intravenous contrast. Multiplanar CT image reconstructions were also generated. RADIATION DOSE REDUCTION: This exam was performed according to the departmental dose-optimization program which includes automated exposure control, adjustment of the mA and/or kV according to patient size and/or use of iterative reconstruction technique. COMPARISON:  01/04/2015 CT, 08/17/2021 cervical spine radiographs FINDINGS: Alignment: Straightening of the normal cervical lordosis. Trace anterolisthesis C2 on C3. Skull base and vertebrae: No acute fracture. No primary bone lesion or focal pathologic process. Status post C4-C6 ACDF, which appears unchanged in alignment compared to 08/17/2021. No definite evidence of perihardware loosening. Soft tissues and spinal canal: No prevertebral fluid or swelling. No visible canal hematoma. Disc levels: No significant spinal canal stenosis. Mild facet and uncovertebral hypertrophy, right-greater-than-left, which causes mild neural foraminal narrowing on the right at C2-C3 and C4-C5, as well as on the left at C5-C6. Upper chest: Negative. Other: None. IMPRESSION: 1.  No acute fracture or traumatic listhesis in the cervical spine. 2. Status post C4-C6 ACDF without evidence of hardware failure. Electronically Signed    By: Merilyn Baba M.D.   On: 11/17/2021 17:06    Procedures Procedures    Medications Ordered in ED Medications  diazepam (VALIUM) injection 2.5 mg (has no administration in time range)  HYDROmorphone (DILAUDID) injection 1 mg (1 mg Intravenous Given 11/17/21 1534)  ondansetron (ZOFRAN) injection 4 mg (4 mg Intravenous Given 11/17/21 1534)    ED Course/ Medical Decision Making/ A&P                           Medical Decision Making  This patient presents to the ED for concern of sudden pain of her right neck and right shoulder, this involves an extensive number of treatment options, and is a complaint that carries with it a high risk of complications and morbidity.  The differential diagnosis includes cervical fracture, nerve  impingement, movement of surgical hardware   Co morbidities that complicate the patient evaluation  Recent surgical discectomy fusion from August 2022   Additional history obtained:  Additional history obtained from surgical notes from 06/2021     Imaging Studies ordered:  I ordered imaging studies including CT C-spine I independently visualized and interpreted imaging which showed no acute fracture or traumatic listhesis of the C-spine no evidence of hardware failure I agree with the radiologist interpretation    Medicines ordered and prescription drug management:  I ordered medication including pain medication and antiemetic  Reevaluation of the patient after these medicines showed that the patient pain temporarily improved but on reevaluation she is reporting recurrent spasms of her right neck and shoulder.  IV anxiolytic ordered I have reviewed the patients home medicines and have made adjustments as needed.  I will have her discontinue her hydrocodone and Flexeril.  States she is no longer taking gabapentin. Was notified by pharmacy that patient had a recent prescription for tizanidine.  We will have patient temporarily discontinue this as well.  Medication was not in pt's medication list.    Reevaluation:  After the interventions noted above, I reevaluated the patient and found that they have : Symptoms have improved.  C-collar removed by nursing staff under my direction and after review of CT imaging   Dispostion:  After consideration of the diagnostic results and the patients response to treatment, I feel that the patent would benefit from changing current pain medication from the codon to oxycodone and will change muscle relaxer from Flexeril to Robaxin.  I feel that she is appropriate for discharge home at this time, doubt emergent process.  She is agreeable to treatment plan with rest, ice packs and close outpatient follow-up with pain management        Final Clinical Impression(s) / ED Diagnoses Final diagnoses:  Torticollis, acute    Rx / DC Orders ED Discharge Orders     None         Kem Parkinson, PA-C 11/17/21 1938    Isla Pence, MD 11/19/21 [phone]

## 2021-11-17 NOTE — ED Notes (Signed)
Pt d/c home with son per MD order. Discharge summary reviewed with pt, pt verbalizes understanding. Ambulatory off unit. No s/s of acute distress noted at discharge. Reports son is discharge ride home.

## 2021-11-17 NOTE — ED Triage Notes (Signed)
States she turned over in bed this am and felt something pop, c/o severe pain in right side of neck and right arm, states she cannot move right arm. C collar applied

## 2021-11-17 NOTE — ED Notes (Signed)
Patient transported to CT 

## 2021-11-17 NOTE — Discharge Instructions (Addendum)
Try applying ice packs on and off to your right neck.  You can also try alternating with heat as well.  Stop taking the Flexeril, tizanidine and hydrocodone.  Start the new medications this evening.  Follow-up with your pain management provider for recheck.  Return to the emergency department for any new or worsening symptoms.

## 2021-12-12 ENCOUNTER — Other Ambulatory Visit (HOSPITAL_COMMUNITY): Payer: Self-pay | Admitting: Physician Assistant

## 2021-12-12 DIAGNOSIS — Z1231 Encounter for screening mammogram for malignant neoplasm of breast: Secondary | ICD-10-CM

## 2021-12-19 ENCOUNTER — Ambulatory Visit (HOSPITAL_COMMUNITY)
Admission: RE | Admit: 2021-12-19 | Discharge: 2021-12-19 | Disposition: A | Discharge status: Home / Self Care | Payer: Medicaid Other | Source: Ambulatory Visit | Attending: Physician Assistant | Admitting: Physician Assistant

## 2021-12-19 ENCOUNTER — Other Ambulatory Visit: Payer: Self-pay

## 2021-12-19 DIAGNOSIS — Z1231 Encounter for screening mammogram for malignant neoplasm of breast: Secondary | ICD-10-CM | POA: Insufficient documentation

## 2022-01-10 ENCOUNTER — Telehealth: Payer: Self-pay

## 2022-01-10 NOTE — Telephone Encounter (Signed)
Notes scanned to referral 

## 2022-01-16 NOTE — Progress Notes (Unsigned)
Cardiology Office Note:    Date:  01/16/2022   ID:  Tiffany Ho, DOB 01-Nov-1981, MRN [id number]  PCP:  [facility] Cardiologist:  Lenna Sciara, MD Referring MD: Heywood Bene, *   Chief Complaint/Reason for Referral: Shortness of breath   Dyspnea, unspecified type  BMI 37.0-37.9, adult  PLAN:    In order of problems listed above:  1.  We will obtain echocardiogram.  TSH and other laboratories recently were within normal limits.  Sleep apnea evaluation.  Follow-up 6 months or earlier if needed.        {Are you ordering a CV Procedure (e.g. stress test, cath, DCCV, TEE, etc)?   Press F2        :[id number]}   Dispo:  No follow-ups on file.     Medication Adjustments/Labs and Tests Ordered: Current medicines are reviewed at length with the patient today.  Concerns regarding medicines are outlined above.   Tests Ordered: No orders of the defined types were placed in this encounter.   Medication Changes: No orders of the defined types were placed in this encounter.   History of Present Illness:    FOCUSED PROBLEM LIST:   1.  Hypertension 2.  BMI of 37.5 kg/m   The patient is a 41 y.o. female with the indicated medical history here for recommendations regarding shortness of breath.  The patient was seen by her primary care provider recently with these complaints.  She denies any chest pain.  Her blood pressure was relatively well controlled at that visit.        Previous Medical History: Past Medical History:  Diagnosis Date   Anxiety    Carpal tunnel syndrome, bilateral    Constipation    Gallstones    Panic attacks    Seasonal allergies      Current Medications: No outpatient medications have been marked as taking for the 01/17/22 encounter (Appointment) with Early Osmond, MD.     Allergies:    Patient has no active allergies.   Social History:   Social History   Tobacco Use    Smoking status: Never   Smokeless tobacco: Never  Vaping Use   Vaping Use: Never used  Substance Use Topics   Alcohol use: No   Drug use: No     Family Hx: No family history on file.   Review of Systems:   Please see the history of present illness.    All other systems reviewed and are negative.     EKGs/Labs/Other Test Reviewed:    EKG: EKG 2016 demonstrates normal sinus rhythm  Prior CV studies: None available    Imaging studies that I have independently reviewed today: CT abdomen without aortic atherosclerosis  Recent Labs: 06/13/2021: BUN 6; Creatinine, Ser 0.72; Hemoglobin 14.2; Platelets 456; Potassium 3.6; Sodium 139   Recent Lipid Panel No results found for: CHOL, TRIG, HDL, LDLCALC, LDLDIRECT  Risk Assessment/Calculations:    {Does this patient have ATRIAL FIBRILLATION?:[phone]}      Physical Exam:    VS:  There were no vitals taken for this visit.   Wt Readings from Last 3 Encounters:  06/14/21 201 lb (91.2 kg)  06/13/21 201 lb 12.8 oz (91.5 kg)  08/13/19 195 lb (88.5 kg)    GENERAL:  No apparent distress, AOx3 HEENT:  No carotid bruits, +2 carotid impulses, no scleral icterus CAR: RRR Irregular RR*** no murmurs***, gallops, rubs, or thrills RES:  Clear to auscultation  bilaterally ABD:  Soft, nontender, nondistended, positive bowel sounds x 4 VASC:  +2 radial pulses, +2 carotid pulses, palpable pedal pulses NEURO:  CN 2-12 grossly intact; motor and sensory grossly intact PSYCH:  No active depression or anxiety EXT:  No edema, ecchymosis, or cyanosis  Signed, Early Osmond, MD  01/16/2022 12:31 PM    [facility] [address], [place], [place]  [address] Phone: [phone]; Fax: [phone]   Note:  This document was prepared using Dragon voice recognition software and may include unintentional dictation errors.

## 2022-01-17 ENCOUNTER — Ambulatory Visit: Payer: Medicaid Other | Admitting: Internal Medicine

## 2022-01-17 ENCOUNTER — Encounter: Payer: Self-pay | Admitting: Internal Medicine

## 2022-01-17 ENCOUNTER — Other Ambulatory Visit: Payer: Self-pay

## 2022-01-17 VITALS — BP 136/72 | HR 98 | Ht 64.0 in | Wt 215.0 lb

## 2022-01-17 DIAGNOSIS — I10 Essential (primary) hypertension: Secondary | ICD-10-CM

## 2022-01-17 DIAGNOSIS — Z6837 Body mass index (BMI) 37.0-37.9, adult: Secondary | ICD-10-CM

## 2022-01-17 DIAGNOSIS — R0600 Dyspnea, unspecified: Secondary | ICD-10-CM

## 2022-01-17 NOTE — Patient Instructions (Addendum)
Medication Instructions:  ?Your physician recommends that you continue on your current medications as directed. Please refer to the Current Medication list given to you today. ? ?*If you need a refill on your cardiac medications before your next appointment, please call your pharmacy* ? ? ?Lab Work: ?None ordered  ? ?If you have labs (blood work) drawn today and your tests are completely normal, you will receive your results only by: ?MyChart Message (if you have MyChart) OR ?A paper copy in the mail ?If you have any lab test that is abnormal or we need to change your treatment, we will call you to review the results. ? ? ?Testing/Procedures: ?Your physician has requested that you have an echocardiogram. Echocardiography is a painless test that uses sound waves to create images of your heart. It provides your doctor with information about the size and shape of your heart and how well your heart?s chambers and valves are working. This procedure takes approximately one hour. There are no restrictions for this procedure. ? ?Your physician has recommended that you have a sleep study. This test records several body functions during sleep, including: brain activity, eye movement, oxygen and carbon dioxide blood levels, heart rate and rhythm, breathing rate and rhythm, the flow of air through your mouth and nose, snoring, body muscle movements, and chest and belly movement. ? ? ? ?Follow-Up: ?Follow up as needed  ? ?Other Instructions ?None   ?

## 2022-01-31 ENCOUNTER — Ambulatory Visit (HOSPITAL_COMMUNITY): Payer: Medicaid Other | Attending: Internal Medicine

## 2022-01-31 ENCOUNTER — Other Ambulatory Visit: Payer: Self-pay

## 2022-01-31 DIAGNOSIS — R0600 Dyspnea, unspecified: Secondary | ICD-10-CM | POA: Diagnosis present

## 2022-01-31 DIAGNOSIS — R0609 Other forms of dyspnea: Secondary | ICD-10-CM

## 2022-01-31 LAB — ECHOCARDIOGRAM COMPLETE
Area-P 1/2: 4.31 cm2
S' Lateral: 3.4 cm

## 2022-02-07 ENCOUNTER — Telehealth: Payer: Self-pay | Admitting: *Deleted

## 2022-02-07 DIAGNOSIS — R0600 Dyspnea, unspecified: Secondary | ICD-10-CM

## 2022-02-07 DIAGNOSIS — Z6837 Body mass index (BMI) 37.0-37.9, adult: Secondary | ICD-10-CM

## 2022-02-07 DIAGNOSIS — I10 Essential (primary) hypertension: Secondary | ICD-10-CM

## 2022-02-07 NOTE — Telephone Encounter (Signed)
4/4 [employer]BER NUMER #[id number] ? ?PENDING AUTH# [id number] ?CALL REF# [id number] ? ?ONCE APPROVED VALID DATES WILL BE 03/01/22--04/30/22  ? ?

## 2022-02-09 NOTE — Telephone Encounter (Signed)
DENIED CAN RESUBMIT FOR A HOME STUDY ? ?

## 2022-02-13 NOTE — Telephone Encounter (Signed)
Per dr Ali Lowe Home Sleep test to be ordered and resubmitted. ?

## 2022-02-17 NOTE — Telephone Encounter (Signed)
NO PA REQUIRED PER SARAH W. 4/14 @  MEDICAID. ?

## 2022-03-06 ENCOUNTER — Other Ambulatory Visit: Payer: Self-pay | Admitting: Neurosurgery

## 2022-03-21 NOTE — Progress Notes (Signed)
Surgical Instructions ? ? ? Your procedure is scheduled on Wednesday, May 24th, 2023. ? ? Report to [facility] Main Entrance "A" at 06:00 A.M., then check in with the Admitting office. ? Call this number if you have problems the morning of surgery: ? [phone] ? ? If you have any questions prior to your surgery date call [phone]: Open Monday-Friday 8am-4pm ? ? ? Remember: ? Do not eat after midnight the night before your surgery ? ?You may drink clear liquids until 05:00 the morning of your surgery.   ?Clear liquids allowed are: Water, Non-Citrus Juices (without pulp), Carbonated Beverages, Clear Tea, Black Coffee ONLY (NO MILK, CREAM OR POWDERED CREAMER of any kind), and Gatorade ?  ? Take these medicines the morning of surgery with A SIP OF WATER:  ? ?amitriptyline (ELAVIL) ?escitalopram (LEXAPRO) ?HYDROcodone-acetaminophen (NORCO/VICODIN) ?hydrOXYzine (ATARAX) ?tiZANidine (ZANAFLEX)  ? ?As of today, STOP taking any Aspirin (unless otherwise instructed by your surgeon) Aleve, Naproxen, Ibuprofen, Motrin, Advil, Goody's, BC's, all herbal medications, fish oil, and all vitamins. ? ? ? The day of surgery: ?         ?Do not wear jewelry or makeup ?Do not wear lotions, powders, perfumes, or deodorant. ?Do not shave 48 hours prior to surgery.   ?Do not bring valuables to the hospital. ?Do not wear nail polish, gel polish, artificial nails, or any other type of covering on natural nails (fingers and toes) ?If you have artificial nails or gel coating that need to be removed by a nail salon, please have this removed prior to surgery. Artificial nails or gel coating may interfere with anesthesia's ability to adequately monitor your vital signs. ? ?[facility] is not responsible for any belongings or valuables. .  ? ?Do NOT Smoke (Tobacco/Vaping)  24 hours prior to your procedure ? ?If you use a CPAP at night, you may bring your mask for your overnight stay. ?  ?Contacts, glasses, hearing aids, dentures or partials  may not be worn into surgery, please bring cases for these belongings ?  ?For patients admitted to the hospital, discharge time will be determined by your treatment team. ?  ?Patients discharged the day of surgery will not be allowed to drive home, and someone needs to stay with them for 24 hours. ? ? ?SURGICAL WAITING ROOM VISITATION ?Patients having surgery or a procedure in a hospital may have two support people. ?Children under the age of 44 must have an adult with them who is not the patient. ?They may stay in the waiting area during the procedure and may switch out with other visitors. If the patient needs to stay at the hospital during part of their recovery, the visitor guidelines for inpatient rooms apply. ? ?Please refer to the [facility] website for the visitor guidelines for Inpatients (after your surgery is over and you are in a regular room).  ? ?Special instructions:   ? ?Oral Hygiene is also important to reduce your risk of infection.  Remember - BRUSH YOUR TEETH THE MORNING OF SURGERY WITH YOUR REGULAR TOOTHPASTE ? ? ?[facility]- Preparing For Surgery ? ?Before surgery, you can play an important role. Because skin is not sterile, your skin needs to be as free of germs as possible. You can reduce the number of germs on your skin by washing with CHG (chlorahexidine gluconate) Soap before surgery.  CHG is an antiseptic cleaner which kills germs and bonds with the skin to continue killing germs even after washing.   ? ? ?  Please do not use if you have an allergy to CHG or antibacterial soaps. If your skin becomes reddened/irritated stop using the CHG.  ?Do not shave (including legs and underarms) for at least 48 hours prior to first CHG shower. It is OK to shave your face. ? ?Please follow these instructions carefully. ?  ? ? Shower the NIGHT BEFORE SURGERY and the MORNING OF SURGERY with CHG Soap.  ? If you chose to wash your hair, wash your hair first as usual with your normal shampoo. After you  shampoo, rinse your hair and body thoroughly to remove the shampoo.  Then [employer] and genitals (private parts) with your normal soap and rinse thoroughly to remove soap. ? ?After that Use CHG Soap as you would any other liquid soap. You can apply CHG directly to the skin and wash gently with a scrungie or a clean washcloth.  ? ?Apply the CHG Soap to your body ONLY FROM THE NECK DOWN.  Do not use on open wounds or open sores. Avoid contact with your eyes, ears, mouth and genitals (private parts). Wash Face and genitals (private parts)  with your normal soap.  ? ?Wash thoroughly, paying special attention to the area where your surgery will be performed. ? ?Thoroughly rinse your body with warm water from the neck down. ? ?DO NOT shower/wash with your normal soap after using and rinsing off the CHG Soap. ? ?Pat yourself dry with a CLEAN TOWEL. ? ?Wear CLEAN PAJAMAS to bed the night before surgery ? ?Place CLEAN SHEETS on your bed the night before your surgery ? ?DO NOT SLEEP WITH PETS. ? ? ?Day of Surgery: ? ?Take a shower with CHG soap. ?Wear Clean/Comfortable clothing the morning of surgery ?Do not apply any deodorants/lotions.   ?Remember to brush your teeth WITH YOUR REGULAR TOOTHPASTE. ? ? ? ?If you received a COVID test during your pre-op visit, it is requested that you wear a mask when out in public, stay away from anyone that may not be feeling well, and notify your surgeon if you develop symptoms. If you have been in contact with anyone that has tested positive in the last 10 days, please notify your surgeon. ? ?  ?Please read over the following fact sheets that you were given.   ?

## 2022-03-22 ENCOUNTER — Inpatient Hospital Stay (HOSPITAL_COMMUNITY)
Admission: RE | Admit: 2022-03-22 | Discharge: 2022-03-22 | Disposition: A | Discharge status: Home / Self Care | Payer: Medicaid Other | Source: Ambulatory Visit

## 2022-03-22 NOTE — Progress Notes (Signed)
Surgical Instructions ? ? ? Your procedure is scheduled on Wednesday, May 24th, 2023. ? ? Report to [facility] Main Entrance "A" at 06:00 A.M., then check in with the Admitting office. ? Call this number if you have problems the morning of surgery: ? [phone] ? ? If you have any questions prior to your surgery date call [phone]: Open Monday-Friday 8am-4pm ? ? ? Remember: ? Do not eat or drink after midnight the night before your surgery ? ?  ? Take these medicines the morning of surgery with A SIP OF WATER:  ? ?escitalopram (LEXAPRO) ?HYDROcodone-acetaminophen (NORCO/VICODIN) ?hydrOXYzine (ATARAX)- if needed ?tiZANidine (ZANAFLEX)  ?loratadine (CLARITIN)  ? ?As of today, STOP taking any Aspirin (unless otherwise instructed by your surgeon) Aleve, Naproxen, Ibuprofen, Motrin, Advil, Goody's, BC's, all herbal medications, fish oil, and all vitamins. ? ? ? The day of surgery: ?         ?Do not wear jewelry or makeup ?Do not wear lotions, powders, perfumes, or deodorant. ?Do not shave 48 hours prior to surgery.   ?Do not bring valuables to the hospital. ?Do not wear nail polish, gel polish, artificial nails, or any other type of covering on natural nails (fingers and toes) ?If you have artificial nails or gel coating that need to be removed by a nail salon, please have this removed prior to surgery. Artificial nails or gel coating may interfere with anesthesia's ability to adequately monitor your vital signs. ? ?[facility] is not responsible for any belongings or valuables. .  ? ?Do NOT Smoke (Tobacco/Vaping)  24 hours prior to your procedure ? ?If you use a CPAP at night, you may bring your mask for your overnight stay. ?  ?Contacts, glasses, hearing aids, dentures or partials may not be worn into surgery, please bring cases for these belongings ?  ?For patients admitted to the hospital, discharge time will be determined by your treatment team. ?  ?Patients discharged the day of surgery will not be allowed to  drive home, and someone needs to stay with them for 24 hours. ? ? ?SURGICAL WAITING ROOM VISITATION ?Patients having surgery or a procedure in a hospital may have two support people. ?Children under the age of 89 must have an adult with them who is not the patient. ?They may stay in the waiting area during the procedure and may switch out with other visitors. If the patient needs to stay at the hospital during part of their recovery, the visitor guidelines for inpatient rooms apply. ? ?Please refer to the [facility] website for the visitor guidelines for Inpatients (after your surgery is over and you are in a regular room).  ? ?Special instructions:   ? ?Oral Hygiene is also important to reduce your risk of infection.  Remember - BRUSH YOUR TEETH THE MORNING OF SURGERY WITH YOUR REGULAR TOOTHPASTE ? ? ?[facility]- Preparing For Surgery ? ?Before surgery, you can play an important role. Because skin is not sterile, your skin needs to be as free of germs as possible. You can reduce the number of germs on your skin by washing with CHG (chlorahexidine gluconate) Soap before surgery.  CHG is an antiseptic cleaner which kills germs and bonds with the skin to continue killing germs even after washing.   ? ? ?Please do not use if you have an allergy to CHG or antibacterial soaps. If your skin becomes reddened/irritated stop using the CHG.  ?Do not shave (including legs and underarms) for at least 48 hours  prior to first CHG shower. It is OK to shave your face. ? ?Please follow these instructions carefully. ?  ? ? Shower the NIGHT BEFORE SURGERY and the MORNING OF SURGERY with CHG Soap.  ? If you chose to wash your hair, wash your hair first as usual with your normal shampoo. After you shampoo, rinse your hair and body thoroughly to remove the shampoo.  Then [employer] and genitals (private parts) with your normal soap and rinse thoroughly to remove soap. ? ?After that Use CHG Soap as you would any other liquid soap. You can  apply CHG directly to the skin and wash gently with a scrungie or a clean washcloth.  ? ?Apply the CHG Soap to your body ONLY FROM THE NECK DOWN.  Do not use on open wounds or open sores. Avoid contact with your eyes, ears, mouth and genitals (private parts). Wash Face and genitals (private parts)  with your normal soap.  ? ?Wash thoroughly, paying special attention to the area where your surgery will be performed. ? ?Thoroughly rinse your body with warm water from the neck down. ? ?DO NOT shower/wash with your normal soap after using and rinsing off the CHG Soap. ? ?Pat yourself dry with a CLEAN TOWEL. ? ?Wear CLEAN PAJAMAS to bed the night before surgery ? ?Place CLEAN SHEETS on your bed the night before your surgery ? ?DO NOT SLEEP WITH PETS. ? ? ?Day of Surgery: ? ?Take a shower with CHG soap. ?Wear Clean/Comfortable clothing the morning of surgery ?Do not apply any deodorants/lotions.   ?Remember to brush your teeth WITH YOUR REGULAR TOOTHPASTE. ? ? ? ?If you received a COVID test during your pre-op visit, it is requested that you wear a mask when out in public, stay away from anyone that may not be feeling well, and notify your surgeon if you develop symptoms. If you have been in contact with anyone that has tested positive in the last 10 days, please notify your surgeon. ? ?  ?Please read over the following fact sheets that you were given.   ?

## 2022-03-23 ENCOUNTER — Encounter (HOSPITAL_COMMUNITY)
Admission: RE | Admit: 2022-03-23 | Discharge: 2022-03-23 | Disposition: A | Discharge status: Home / Self Care | Payer: Medicaid Other | Source: Ambulatory Visit | Attending: Neurosurgery | Admitting: Neurosurgery

## 2022-03-23 ENCOUNTER — Encounter (HOSPITAL_COMMUNITY): Payer: Self-pay

## 2022-03-23 ENCOUNTER — Other Ambulatory Visit: Payer: Self-pay

## 2022-03-23 VITALS — BP 133/81 | HR 90 | Temp 98.2°F | Resp 18 | Ht 64.0 in | Wt 219.2 lb

## 2022-03-23 DIAGNOSIS — Z01812 Encounter for preprocedural laboratory examination: Secondary | ICD-10-CM | POA: Diagnosis present

## 2022-03-23 DIAGNOSIS — M5412 Radiculopathy, cervical region: Secondary | ICD-10-CM | POA: Insufficient documentation

## 2022-03-23 DIAGNOSIS — Z01818 Encounter for other preprocedural examination: Secondary | ICD-10-CM

## 2022-03-23 HISTORY — DX: Unspecified osteoarthritis, unspecified site: M19.90

## 2022-03-23 HISTORY — DX: Essential (primary) hypertension: I10

## 2022-03-23 LAB — BASIC METABOLIC PANEL
Anion gap: 8 (ref 5–15)
BUN: 9 mg/dL (ref 6–20)
CO2: 23 mmol/L (ref 22–32)
Calcium: 8.9 mg/dL (ref 8.9–10.3)
Chloride: 107 mmol/L (ref 98–111)
Creatinine, Ser: 0.71 mg/dL (ref 0.44–1.00)
GFR, Estimated: >60 mL/min (ref >60)
Glucose, Bld: 110 mg/dL — ABNORMAL HIGH (ref 70–99)
Potassium: 4 mmol/L (ref 3.5–5.1)
Sodium: 138 mmol/L (ref 135–145)

## 2022-03-23 LAB — CBC
HCT: 39.5 % (ref 36.0–46.0)
Hemoglobin: 13.3 g/dL (ref 12.0–15.0)
MCH: 29.7 pg (ref 26.0–34.0)
MCHC: 33.7 g/dL (ref 30.0–36.0)
MCV: 88.2 fL (ref 80.0–100.0)
Platelets: 404 10*3/uL — ABNORMAL HIGH (ref 150–400)
RBC: 4.48 MIL/uL (ref 3.87–5.11)
RDW: 12.9 % (ref 11.5–15.5)
WBC: 7.2 10*3/uL (ref 4.0–10.5)
nRBC: 0 % (ref 0.0–0.2)

## 2022-03-23 LAB — SURGICAL PCR SCREEN
MRSA, PCR: NEGATIVE
Staphylococcus aureus: NEGATIVE

## 2022-03-23 NOTE — Progress Notes (Signed)
PCP - [facility] in [place] Clyde Lundborg) Cardiologist - Lenna Sciara  PPM/ICD - denies   Chest x-ray - n/a EKG - 01/17/22 Stress Test - denies ECHO - 01/31/22 Cardiac Cath - denies  As of today, STOP taking any Aspirin (unless otherwise instructed by your surgeon) Aleve, Naproxen, Ibuprofen, Motrin, Advil, Goody's, BC's, all herbal medications, fish oil, and all vitamins.  ERAS Protcol -no   COVID TEST- not needed   Anesthesia review: no  Patient denies shortness of breath, fever, cough and chest pain at PAT appointment   All instructions explained to the patient, with a verbal understanding of the material. Patient agrees to go over the instructions while at home for a better understanding. Patient also instructed to self quarantine after being tested for COVID-19. The opportunity to ask questions was provided.

## 2022-03-28 ENCOUNTER — Ambulatory Visit: Admit: 2022-03-28 | Payer: Medicaid Other | Admitting: Neurosurgery

## 2022-03-28 SURGERY — ANTERIOR CERVICAL DECOMPRESSION/DISCECTOMY FUSION 1 LEVEL
Anesthesia: General

## 2022-03-29 NOTE — Anesthesia Preprocedure Evaluation (Signed)
Anesthesia Evaluation  Patient identified by MRN, date of birth, ID band Patient awake    Reviewed: Allergy & Precautions, NPO status , Patient's Chart, lab work & pertinent test results  Airway Mallampati: II  TM Distance: >3 FB Neck ROM: Limited    Dental  (+) Dental Advisory Given, Missing,    Pulmonary neg pulmonary ROS,    Pulmonary exam normal breath sounds clear to auscultation       Cardiovascular hypertension, Pt. on medications Normal cardiovascular exam Rhythm:Regular Rate:Normal     Neuro/Psych PSYCHIATRIC DISORDERS Anxiety Cervical stenosis   Neuromuscular disease    GI/Hepatic negative GI ROS, Neg liver ROS,   Endo/Other  Obesity   Renal/GU negative Renal ROS     Musculoskeletal  (+) Arthritis ,   Abdominal   Peds  Hematology negative hematology ROS (+)   Anesthesia Other Findings   Reproductive/Obstetrics negative OB ROS                            Anesthesia Physical Anesthesia Plan  ASA: 2  Anesthesia Plan: General   Post-op Pain Management: Tylenol PO (pre-op)*   Induction: Intravenous  PONV Risk Score and Plan: 3 and Midazolam, Dexamethasone and Ondansetron  Airway Management Planned: Video Laryngoscope Planned and Oral ETT  Additional Equipment:   Intra-op Plan:   Post-operative Plan: Extubation in OR  Informed Consent: I have reviewed the patients History and Physical, chart, labs and discussed the procedure including the risks, benefits and alternatives for the proposed anesthesia with the patient or authorized representative who has indicated his/her understanding and acceptance.     Dental advisory given  Plan Discussed with: CRNA  Anesthesia Plan Comments:        Anesthesia Quick Evaluation

## 2022-03-29 NOTE — Progress Notes (Signed)
Patient was informed that her surgery was moved from 03/29/22 to Thursday, 03/30/22 at 8 am.  Patient will arrive on Thursday at 6 AM.  Patient verified understanding of the above changes.Marland Kitchen

## 2022-03-30 ENCOUNTER — Observation Stay (HOSPITAL_COMMUNITY)
Admission: RE | Admit: 2022-03-30 | Discharge: 2022-03-30 | Disposition: A | Discharge status: Home / Self Care | Payer: Medicaid Other | Attending: Neurosurgery | Admitting: Neurosurgery

## 2022-03-30 ENCOUNTER — Ambulatory Visit (HOSPITAL_COMMUNITY): Payer: Medicaid Other

## 2022-03-30 ENCOUNTER — Encounter (HOSPITAL_COMMUNITY)
Admission: RE | Disposition: A | Discharge status: Home / Self Care | Payer: Self-pay | Source: Home / Self Care | Attending: Neurosurgery

## 2022-03-30 ENCOUNTER — Ambulatory Visit (HOSPITAL_COMMUNITY): Payer: Medicaid Other | Admitting: Anesthesiology

## 2022-03-30 ENCOUNTER — Other Ambulatory Visit: Payer: Self-pay

## 2022-03-30 ENCOUNTER — Encounter (HOSPITAL_COMMUNITY): Payer: Self-pay | Admitting: Neurosurgery

## 2022-03-30 ENCOUNTER — Ambulatory Visit (HOSPITAL_BASED_OUTPATIENT_CLINIC_OR_DEPARTMENT_OTHER): Payer: Medicaid Other | Admitting: Anesthesiology

## 2022-03-30 DIAGNOSIS — M4802 Spinal stenosis, cervical region: Principal | ICD-10-CM | POA: Diagnosis present

## 2022-03-30 DIAGNOSIS — I10 Essential (primary) hypertension: Secondary | ICD-10-CM | POA: Diagnosis not present

## 2022-03-30 DIAGNOSIS — Z01818 Encounter for other preprocedural examination: Secondary | ICD-10-CM

## 2022-03-30 DIAGNOSIS — G959 Disease of spinal cord, unspecified: Secondary | ICD-10-CM

## 2022-03-30 DIAGNOSIS — Z79899 Other long term (current) drug therapy: Secondary | ICD-10-CM | POA: Diagnosis not present

## 2022-03-30 HISTORY — PX: ANTERIOR CERVICAL DECOMP/DISCECTOMY FUSION: SHX1161

## 2022-03-30 LAB — POCT PREGNANCY, URINE: Preg Test, Ur: NEGATIVE

## 2022-03-30 SURGERY — ANTERIOR CERVICAL DECOMPRESSION/DISCECTOMY FUSION 1 LEVEL
Anesthesia: General | Site: Neck

## 2022-03-30 MED ORDER — FENTANYL CITRATE (PF) 250 MCG/5ML IJ SOLN
INTRAMUSCULAR | Status: AC
  Filled 2022-03-30: qty 5

## 2022-03-30 MED ORDER — HYDROCODONE-ACETAMINOPHEN 10-325 MG PO TABS: 1.0000 | ORAL_TABLET | ORAL | 0 refills | Status: DC | PRN

## 2022-03-30 MED ORDER — MIDAZOLAM HCL 2 MG/2ML IJ SOLN: 1.0000 mg | Freq: Once | INTRAMUSCULAR | Status: DC

## 2022-03-30 MED ORDER — MIDAZOLAM HCL 2 MG/2ML IJ SOLN
INTRAMUSCULAR | Status: AC
  Filled 2022-03-30: qty 2

## 2022-03-30 MED ORDER — CYCLOBENZAPRINE HCL 10 MG PO TABS: 10.0000 mg | ORAL_TABLET | Freq: Three times a day (TID) | ORAL | 0 refills | Status: DC | PRN

## 2022-03-30 MED ORDER — ESCITALOPRAM OXALATE 20 MG PO TABS
20.0000 mg | ORAL_TABLET | Freq: Every morning | ORAL | Status: DC
  Administered 2022-03-30: 20 mg via ORAL
  Filled 2022-03-30: qty 1

## 2022-03-30 MED ORDER — AMITRIPTYLINE HCL 25 MG PO TABS: 25.0000 mg | ORAL_TABLET | Freq: Every evening | ORAL | Status: DC | PRN

## 2022-03-30 MED ORDER — PHENYLEPHRINE 80 MCG/ML (10ML) SYRINGE FOR IV PUSH (FOR BLOOD PRESSURE SUPPORT)
PREFILLED_SYRINGE | INTRAVENOUS | Status: DC | PRN
  Administered 2022-03-30: 120 ug via INTRAVENOUS
  Administered 2022-03-30: 160 ug via INTRAVENOUS
  Administered 2022-03-30: 40 ug via INTRAVENOUS

## 2022-03-30 MED ORDER — SODIUM CHLORIDE 0.9% FLUSH: 3.0000 mL | INTRAVENOUS | Status: DC | PRN

## 2022-03-30 MED ORDER — SCOPOLAMINE 1 MG/3DAYS TD PT72
MEDICATED_PATCH | TRANSDERMAL | Status: AC
  Filled 2022-03-30: qty 1

## 2022-03-30 MED ORDER — LOSARTAN POTASSIUM 50 MG PO TABS
50.0000 mg | ORAL_TABLET | Freq: Every morning | ORAL | Status: DC
  Administered 2022-03-30: 50 mg via ORAL
  Filled 2022-03-30: qty 1

## 2022-03-30 MED ORDER — PROPOFOL 10 MG/ML IV BOLUS
INTRAVENOUS | Status: DC | PRN
  Administered 2022-03-30: 170 mg via INTRAVENOUS

## 2022-03-30 MED ORDER — ROCURONIUM BROMIDE 10 MG/ML (PF) SYRINGE
PREFILLED_SYRINGE | INTRAVENOUS | Status: DC | PRN
  Administered 2022-03-30: 60 mg via INTRAVENOUS

## 2022-03-30 MED ORDER — HYDROXYZINE HCL 25 MG PO TABS: 25.0000 mg | ORAL_TABLET | Freq: Three times a day (TID) | ORAL | Status: DC | PRN

## 2022-03-30 MED ORDER — ROCURONIUM BROMIDE 10 MG/ML (PF) SYRINGE
PREFILLED_SYRINGE | INTRAVENOUS | Status: AC
  Filled 2022-03-30: qty 10

## 2022-03-30 MED ORDER — HYDROMORPHONE HCL 1 MG/ML IJ SOLN
0.2500 mg | INTRAMUSCULAR | Status: DC | PRN
  Administered 2022-03-30 (×4): 0.5 mg via INTRAVENOUS

## 2022-03-30 MED ORDER — ONDANSETRON HCL 4 MG/2ML IJ SOLN: 4.0000 mg | Freq: Four times a day (QID) | INTRAMUSCULAR | Status: DC | PRN

## 2022-03-30 MED ORDER — DEXAMETHASONE SODIUM PHOSPHATE 10 MG/ML IJ SOLN
INTRAMUSCULAR | Status: DC | PRN
  Administered 2022-03-30: 10 mg via INTRAVENOUS

## 2022-03-30 MED ORDER — TIZANIDINE HCL 2 MG PO TABS: 2.0000 mg | ORAL_TABLET | Freq: Three times a day (TID) | ORAL | 0 refills | Status: DC

## 2022-03-30 MED ORDER — ONDANSETRON HCL 4 MG PO TABS: 4.0000 mg | ORAL_TABLET | Freq: Four times a day (QID) | ORAL | Status: DC | PRN

## 2022-03-30 MED ORDER — ONDANSETRON HCL 4 MG/2ML IJ SOLN: 4.0000 mg | Freq: Once | INTRAMUSCULAR | Status: DC | PRN

## 2022-03-30 MED ORDER — THROMBIN 5000 UNITS EX SOLR
CUTANEOUS | Status: AC
  Filled 2022-03-30: qty 10000

## 2022-03-30 MED ORDER — ORAL CARE MOUTH RINSE: 15.0000 mL | Freq: Once | OROMUCOSAL | Status: AC

## 2022-03-30 MED ORDER — ACETAMINOPHEN 325 MG PO TABS: 650.0000 mg | ORAL_TABLET | ORAL | Status: DC | PRN

## 2022-03-30 MED ORDER — CEFAZOLIN SODIUM-DEXTROSE 1-4 GM/50ML-% IV SOLN
1.0000 g | Freq: Three times a day (TID) | INTRAVENOUS | Status: DC
  Administered 2022-03-30: 1 g via INTRAVENOUS
  Filled 2022-03-30: qty 50

## 2022-03-30 MED ORDER — LACTATED RINGERS IV SOLN: INTRAVENOUS | Status: DC

## 2022-03-30 MED ORDER — MENTHOL 3 MG MT LOZG: 1.0000 | LOZENGE | OROMUCOSAL | Status: DC | PRN

## 2022-03-30 MED ORDER — ONDANSETRON HCL 4 MG/2ML IJ SOLN
INTRAMUSCULAR | Status: DC | PRN
  Administered 2022-03-30: 4 mg via INTRAVENOUS

## 2022-03-30 MED ORDER — SUGAMMADEX SODIUM 200 MG/2ML IV SOLN
INTRAVENOUS | Status: DC | PRN
  Administered 2022-03-30: 200 mg via INTRAVENOUS

## 2022-03-30 MED ORDER — PROPOFOL 10 MG/ML IV BOLUS
INTRAVENOUS | Status: AC
  Filled 2022-03-30: qty 20

## 2022-03-30 MED ORDER — ONDANSETRON HCL 4 MG/2ML IJ SOLN
INTRAMUSCULAR | Status: AC
  Filled 2022-03-30: qty 2

## 2022-03-30 MED ORDER — CEFAZOLIN SODIUM-DEXTROSE 2-4 GM/100ML-% IV SOLN
2.0000 g | INTRAVENOUS | Status: AC
  Administered 2022-03-30: 2 g via INTRAVENOUS
  Filled 2022-03-30: qty 100

## 2022-03-30 MED ORDER — LIDOCAINE 2% (20 MG/ML) 5 ML SYRINGE
INTRAMUSCULAR | Status: AC
  Filled 2022-03-30: qty 5

## 2022-03-30 MED ORDER — 0.9 % SODIUM CHLORIDE (POUR BTL) OPTIME
TOPICAL | Status: DC | PRN
  Administered 2022-03-30: 1000 mL

## 2022-03-30 MED ORDER — FENTANYL CITRATE (PF) 250 MCG/5ML IJ SOLN
INTRAMUSCULAR | Status: DC | PRN
  Administered 2022-03-30 (×4): 50 ug via INTRAVENOUS
  Administered 2022-03-30: 100 ug via INTRAVENOUS
  Administered 2022-03-30: 50 ug via INTRAVENOUS

## 2022-03-30 MED ORDER — CHLORHEXIDINE GLUCONATE 0.12 % MT SOLN
15.0000 mL | Freq: Once | OROMUCOSAL | Status: AC
  Administered 2022-03-30: 15 mL via OROMUCOSAL
  Filled 2022-03-30: qty 15

## 2022-03-30 MED ORDER — PHENOL 1.4 % MT LIQD: 1.0000 | OROMUCOSAL | Status: DC | PRN

## 2022-03-30 MED ORDER — HYDROCODONE-ACETAMINOPHEN 10-325 MG PO TABS
2.0000 | ORAL_TABLET | ORAL | Status: DC | PRN
  Administered 2022-03-30 (×2): 2 via ORAL
  Filled 2022-03-30: qty 2

## 2022-03-30 MED ORDER — SODIUM CHLORIDE 0.9% FLUSH
3.0000 mL | Freq: Two times a day (BID) | INTRAVENOUS | Status: DC
  Administered 2022-03-30: 3 mL via INTRAVENOUS

## 2022-03-30 MED ORDER — HYDROMORPHONE HCL 1 MG/ML IJ SOLN
INTRAMUSCULAR | Status: AC
  Filled 2022-03-30: qty 1

## 2022-03-30 MED ORDER — TIZANIDINE HCL 2 MG PO TABS
2.0000 mg | ORAL_TABLET | Freq: Three times a day (TID) | ORAL | Status: DC
Start: 2022-03-30 — End: 2022-03-30
  Administered 2022-03-30: 2 mg via ORAL
  Filled 2022-03-30 (×2): qty 1

## 2022-03-30 MED ORDER — THROMBIN 5000 UNITS EX SOLR
CUTANEOUS | Status: DC | PRN
  Administered 2022-03-30 (×2): 5000 [IU] via TOPICAL

## 2022-03-30 MED ORDER — OMEGA-3-ACID ETHYL ESTERS 1 G PO CAPS
1.0000 g | ORAL_CAPSULE | Freq: Every day | ORAL | Status: DC
  Administered 2022-03-30: 1 g via ORAL
  Filled 2022-03-30: qty 1

## 2022-03-30 MED ORDER — VITAMIN D (ERGOCALCIFEROL) 1.25 MG (50000 UNIT) PO CAPS: 100000.0000 [IU] | ORAL_CAPSULE | ORAL | Status: DC

## 2022-03-30 MED ORDER — HYDROCODONE-ACETAMINOPHEN 5-325 MG PO TABS: 1.0000 | ORAL_TABLET | ORAL | Status: DC | PRN

## 2022-03-30 MED ORDER — SCOPOLAMINE 1 MG/3DAYS TD PT72SCOPOLAMINE 1 MG/3DAYS
MEDICATED_PATCH | TRANSDERMAL | Status: DC | PRN
Start: 2022-03-30 — End: 2022-03-30
  Administered 2022-03-30: 1 via TRANSDERMAL

## 2022-03-30 MED ORDER — ACETAMINOPHEN 650 MG RE SUPP
650.0000 mg | RECTAL | Status: DC | PRN
Start: 2022-03-30 — End: 2022-03-30

## 2022-03-30 MED ORDER — HEMOSTATIC AGENTS (NO CHARGE) OPTIME
TOPICAL | Status: DC | PRN
  Administered 2022-03-30: 1

## 2022-03-30 MED ORDER — HYDROMORPHONE HCL 1 MG/ML IJ SOLN
1.0000 mg | INTRAMUSCULAR | Status: DC | PRN
  Administered 2022-03-30: 1 mg via INTRAVENOUS
  Filled 2022-03-30: qty 1

## 2022-03-30 MED ORDER — LIDOCAINE 2% (20 MG/ML) 5 ML SYRINGE
INTRAMUSCULAR | Status: DC | PRN
  Administered 2022-03-30: 80 mg via INTRAVENOUS

## 2022-03-30 MED ORDER — ACETAMINOPHEN 500 MG PO TABS
1000.0000 mg | ORAL_TABLET | Freq: Once | ORAL | Status: AC
  Administered 2022-03-30: 1000 mg via ORAL
  Filled 2022-03-30: qty 2

## 2022-03-30 MED ORDER — DEXAMETHASONE SODIUM PHOSPHATE 10 MG/ML IJ SOLN
INTRAMUSCULAR | Status: AC
  Filled 2022-03-30: qty 1

## 2022-03-30 MED ORDER — FENTANYL CITRATE (PF) 100 MCG/2ML IJ SOLN: 25.0000 ug | INTRAMUSCULAR | Status: DC | PRN

## 2022-03-30 MED ORDER — HYDROCODONE-ACETAMINOPHEN 10-325 MG PO TABS
ORAL_TABLET | ORAL | Status: AC
  Filled 2022-03-30: qty 1

## 2022-03-30 MED ORDER — SODIUM CHLORIDE 0.9 % IV SOLN
250.0000 mL | INTRAVENOUS | Status: DC
  Administered 2022-03-30: 250 mL via INTRAVENOUS

## 2022-03-30 MED ORDER — PHENYLEPHRINE 80 MCG/ML (10ML) SYRINGE FOR IV PUSH (FOR BLOOD PRESSURE SUPPORT)
PREFILLED_SYRINGE | INTRAVENOUS | Status: AC
  Filled 2022-03-30: qty 10

## 2022-03-30 MED ORDER — MIDAZOLAM HCL 5 MG/5ML IJ SOLN
INTRAMUSCULAR | Status: DC | PRN
  Administered 2022-03-30: 2 mg via INTRAVENOUS

## 2022-03-30 MED ORDER — LORATADINE 10 MG PO TABS
10.0000 mg | ORAL_TABLET | Freq: Every morning | ORAL | Status: DC
  Administered 2022-03-30: 10 mg via ORAL
  Filled 2022-03-30: qty 1

## 2022-03-30 SURGICAL SUPPLY — 57 items
APL SKNCLS STERI-STRIP NONHPOA (GAUZE/BANDAGES/DRESSINGS) ×1
BAG COUNTER SPONGE SURGICOUNT (BAG) ×3 IMPLANT
BAG DECANTER FOR FLEXI CONT (MISCELLANEOUS) ×3 IMPLANT
BAG SPNG CNTER NS LX DISP (BAG) ×1
BAND INSRT 18 STRL LF DISP RB (MISCELLANEOUS) ×2
BAND RUBBER #18 3X1/16 STRL (MISCELLANEOUS) ×6 IMPLANT
BENZOIN TINCTURE PRP APPL 2/3 (GAUZE/BANDAGES/DRESSINGS) ×3 IMPLANT
BIT DRILL 13 (BIT) ×1 IMPLANT
BUR MATCHSTICK NEURO 3.0 LAGG (BURR) ×3 IMPLANT
CAGE PEEK 7X14X11 (Cage) ×2 IMPLANT
CANISTER SUCT 3000ML PPV (MISCELLANEOUS) ×3 IMPLANT
CARTRIDGE OIL MAESTRO DRILL (MISCELLANEOUS) ×2 IMPLANT
DIFFUSER DRILL AIR PNEUMATIC (MISCELLANEOUS) ×3 IMPLANT
DRAPE C-ARM 42X72 X-RAY (DRAPES) ×6 IMPLANT
DRAPE LAPAROTOMY 100X72 PEDS (DRAPES) ×3 IMPLANT
DRAPE MICROSCOPE LEICA (MISCELLANEOUS) ×3 IMPLANT
DURAPREP 6ML APPLICATOR 50/CS (WOUND CARE) ×3 IMPLANT
ELECT COATED BLADE 2.86 ST (ELECTRODE) ×3 IMPLANT
ELECT REM PT RETURN 9FT ADLT (ELECTROSURGICAL) ×2
ELECTRODE REM PT RTRN 9FT ADLT (ELECTROSURGICAL) ×2 IMPLANT
GAUZE 4X4 16PLY ~~LOC~~+RFID DBL (SPONGE) IMPLANT
GAUZE SPONGE 4X4 12PLY STRL (GAUZE/BANDAGES/DRESSINGS) ×3 IMPLANT
GLOVE ECLIPSE 9.0 STRL (GLOVE) ×3 IMPLANT
GLOVE EXAM NITRILE XL STR (GLOVE) IMPLANT
GOWN STRL REUS W/ TWL LRG LVL3 (GOWN DISPOSABLE) IMPLANT
GOWN STRL REUS W/ TWL XL LVL3 (GOWN DISPOSABLE) IMPLANT
GOWN STRL REUS W/TWL 2XL LVL3 (GOWN DISPOSABLE) IMPLANT
GOWN STRL REUS W/TWL LRG LVL3 (GOWN DISPOSABLE)
GOWN STRL REUS W/TWL XL LVL3 (GOWN DISPOSABLE)
HALTER HD/CHIN CERV TRACTION D (MISCELLANEOUS) ×3 IMPLANT
HEMOSTAT POWDER KIT SURGIFOAM (HEMOSTASIS) IMPLANT
KIT BASIN OR (CUSTOM PROCEDURE TRAY) ×3 IMPLANT
KIT TURNOVER KIT B (KITS) ×3 IMPLANT
NDL SPNL 20GX3.5 QUINCKE YW (NEEDLE) ×2 IMPLANT
NEEDLE SPNL 20GX3.5 QUINCKE YW (NEEDLE) ×2 IMPLANT
NS IRRIG 1000ML POUR BTL (IV SOLUTION) ×3 IMPLANT
OIL CARTRIDGE MAESTRO DRILL (MISCELLANEOUS) ×2
PACK LAMINECTOMY NEURO (CUSTOM PROCEDURE TRAY) ×3 IMPLANT
PAD ARMBOARD 7.5X6 YLW CONV (MISCELLANEOUS) ×9 IMPLANT
PLATE ELITE SPINE 60MM (Plate) ×1 IMPLANT
PUTTY DBX 1CC (Putty) ×2 IMPLANT
PUTTY DBX 1CC DEPUY (Putty) IMPLANT
SCREW ST 13X4XST VA NS SPNE (Screw) IMPLANT
SCREW ST VAR 4 ATL (Screw) ×16 IMPLANT
SPACER SPNL 11X14X7XPEEK CVD (Cage) IMPLANT
SPCR SPNL 11X14X7XPEEK CVD (Cage) ×1 IMPLANT
SPONGE INTESTINAL PEANUT (DISPOSABLE) ×3 IMPLANT
SPONGE SURGIFOAM ABS GEL SZ50 (HEMOSTASIS) ×3 IMPLANT
STRIP CLOSURE SKIN 1/2X4 (GAUZE/BANDAGES/DRESSINGS) ×3 IMPLANT
SUT VIC AB 3-0 SH 8-18 (SUTURE) ×3 IMPLANT
SUT VIC AB 4-0 RB1 18 (SUTURE) ×3 IMPLANT
TAPE CLOTH 4X10 WHT NS (GAUZE/BANDAGES/DRESSINGS) ×3 IMPLANT
TAPE CLOTH SURG 4X10 WHT LF (GAUZE/BANDAGES/DRESSINGS) ×1 IMPLANT
TOWEL GREEN STERILE (TOWEL DISPOSABLE) ×3 IMPLANT
TOWEL GREEN STERILE FF (TOWEL DISPOSABLE) ×3 IMPLANT
TRAP SPECIMEN MUCUS 40CC (MISCELLANEOUS) ×3 IMPLANT
WATER STERILE IRR 1000ML POUR (IV SOLUTION) ×3 IMPLANT

## 2022-03-30 NOTE — Discharge Summary (Signed)
Physician Discharge Summary  Patient ID: CHANCEY CULLINANE MRN: [id number] DOB/AGE: 04/24/1981 41 y.o.  Admit date: 03/30/2022 Discharge date: 03/30/2022  Admission Diagnoses:  Discharge Diagnoses:  Principal Problem:   Cervical stenosis of spinal canal   Discharged Condition: good  [facility]plicated J9-4 anterior cervical decompression and fusion surgery.  Postop please she is doing well.  She has expected amount of neck discomfort.  She is having no upper extremity symptoms.  She is having no weakness or sensory loss.  She is having no wound issues.  She is swallowing well.  She is ambulating and voiding without   Difficulty.  Consults:   Significant Diagnostic Studies:   Treatments:   Discharge Exam: Blood pressure (!) 145/69, pulse (!) 109, temperature 97.9 F (36.6 C), resp. rate 18, height '5\' 4"'$  (1.626 m), weight 99.8 kg, last menstrual period 03/20/2022, SpO2 97 %.   Awake and alert.  Oriented and appropriate.  Motor sensory function grossly intact.  Wound clean and dry.  Neck is soft.  Chest and abdomen benign. Disposition: Discharge disposition: 01-Home or Self Care        Allergies as of 03/30/2022   No Active Allergies      Medication List     TAKE these medications    amitriptyline 25 MG tablet Commonly known as: ELAVIL Take 25-50 mg by mouth at bedtime as needed for sleep.   escitalopram 20 MG tablet Commonly known as: LEXAPRO Take 20 mg by mouth in the morning.   FISH OIL PO Take 1,200 mg by mouth in the morning and at bedtime.   HYDROcodone-acetaminophen 5-325 MG tablet Commonly known as: NORCO/VICODIN Take 1 tablet by mouth in the morning and at bedtime. What changed: Another medication with the same name was added. Make sure you understand how and when to take each.   HYDROcodone-acetaminophen 10-325 MG tablet Commonly known as: NORCO Take 1-2 tablets by mouth every 4 (four)  hours as needed for severe pain ((score 7 to 10)). What changed: You were already taking a medication with the same name, and this prescription was added. Make sure you understand how and when to take each.   hydrOXYzine 25 MG tablet Commonly known as: ATARAX Take 25 mg by mouth 3 (three) times daily as needed for anxiety.   loratadine 10 MG tablet Commonly known as: CLARITIN Take 10 mg by mouth in the morning.   losartan 50 MG tablet Commonly known as: COZAAR Take 50 mg by mouth in the morning.   tiZANidine 2 MG tablet Commonly known as: ZANAFLEX Take 2 mg by mouth 3 (three) times daily. What changed: Another medication with the same name was added. Make sure you understand how and when to take each.   tiZANidine 2 MG tablet Commonly known as: ZANAFLEX Take 1 tablet (2 mg total) by mouth 3 (three) times daily. What changed: You were already taking a medication with the same name, and this prescription was added. Make sure you understand how and when to take each.   Vitamin D (Ergocalciferol) 1.25 MG (50000 UNIT) Caps capsule Commonly known as: DRISDOL Take 100,000 Units by mouth every Sunday.         Signed: Cooper Render Jozee Hammer 03/30/2022, 4:05 PM

## 2022-03-30 NOTE — Op Note (Signed)
Date of procedure: 03/30/2022  Date of dictation: Same  Service: Neurosurgery  Preoperative diagnosis: Cervical stenosis with myelopathy  Postoperative diagnosis: Same  Procedure Name: C3-4 anterior cervical discectomy with interbody fusion utilizing interbody peek cage, local harvested autograft, morselized allograft, and anterior plate instrumentation  Surgeon:Khamani Fairley A.Janyia Guion, M.D.  Asst. Surgeon: Reinaldo Meeker, NP  Anesthesia: General  Indication: 41 year old female with past history of prior C4-5 and C5-6 anterior cervical discectomy and fusion presents now with worsening neck and bilateral upper extremity pain dysesthesias and weakness.  Work-up demonstrates evidence of a large central disc herniation with marked cord compression at C3-4.  Patient has failed conservative management presents now for decompression and fusion in hopes of improving her symptoms.  Operative note: After induction of anesthesia, patient positioned supine with neck slightly extended and held placed halter traction.  Patient's anterior cervical region prepped and draped sterilely.  Incision made overlying C5.  Dissection performed on the right.  Retractor placed.  Fluoroscopy used.  Level confirmed.  Previous plate at X3-8 and 6 was disassembled and removed.  Fusions inspected and found to be grossly stable.  Disc base at C3-4 in size.  Discectomy performed using various instruments down to level the posterior annulus.  Microscope was then brought to the field used throughout the remainder of the discectomy remaining aspects of annulus and osteophytes removed using high-speed drill down to level the posterior logical limb.  Posterior logical was then elevated and resected in piecemeal fashion.  Underlying thecal sac was identified.  Wide central decompression then performed undercutting the bodies of C3 and C4.  Decompression then proceeded each neural foramina.  Wide anterior foraminotomies were performed on the course  exiting C4 nerve roots bilaterally.  Patient had findings of a significant disc herniation which had ruptured through most of the posterior logical ligament which was removed.  Wound was then irrigated.  Gelfoam was placed topically for hemostasis then removed.  7 mm [employer] anatomic peek cage was then packed with locally harvested autograft and demineralized bone matrix.  This then impacted into place and recessed slightly from the anterior cortical margin.  Plate was then placed over the C3, C4, C5 and C6 levels.  This then attached under fluoroscopic guidance using 13 mm variable angle screws to each at all levels.  All screws given final tightening found to be solidly within the bone.  Locking screws engaged all levels.  Final images reveal good position of cages and the hardware at the proper level with normal alignment of the spine.  Wound was then irrigated.  Hemostasis was assured.  Wound was then closed in layers with Vicryl sutures.  Steri-Strips and sterile dressing were applied.  No apparent complications.  Patient tolerated the procedure well and she returns to the recovery room postop.

## 2022-03-30 NOTE — Anesthesia Postprocedure Evaluation (Signed)
Anesthesia Post Note  Patient: Tiffany Ho  Procedure(s) Performed: ACDF - C3-C4 (Neck)     Patient location during evaluation: PACU Anesthesia Type: General Level of consciousness: awake and alert Pain management: pain level controlled Vital Signs Assessment: post-procedure vital signs reviewed and stable Respiratory status: spontaneous breathing, nonlabored ventilation and respiratory function stable Cardiovascular status: blood pressure returned to baseline and stable Postop Assessment: no apparent nausea or vomiting Anesthetic complications: no   No notable events documented.  Last Vitals:  Vitals:   03/30/22 1000 03/30/22 1015  BP: 128/85 (!) 142/94  Pulse: 86 93  Resp: 16 14  Temp:    SpO2: 93% 98%    Last Pain:  Vitals:   03/30/22 1015  TempSrc:   PainSc: [place]

## 2022-03-30 NOTE — H&P (Signed)
Tiffany Ho is an 41 y.o. female.   Chief Complaint: Neck pain HPI: 41 year old female with history of prior C4-5 and C5-6 anterior cervical discectomy and fusion presents with worsening neck pain associated headaches and radiating pain numbness and paresthesias into both upper and lower extremities.  Work-up demonstrates evidence of an adjacent level disc herniation with spinal cord compression at C3-4.  Patient presents now for decompression and fusion at C3-4 in hopes of improving her symptoms.  Past Medical History:  Diagnosis Date   Anxiety    Arthritis    degenerative disc disease and carpal tunnel bilateral hands   Carpal tunnel syndrome, bilateral    Cervical radiculopathy    Constipation    Diaphoresis    Gallstones    Hair loss disorder    Hypertension    Malaise and fatigue    Panic attacks    Seasonal allergies    SOB (shortness of breath)     Past Surgical History:  Procedure Laterality Date   ANTERIOR CERVICAL DECOMP/DISCECTOMY FUSION N/A 06/14/2021   Procedure: Anterior Cervical Decompression Discectomy Fusion Cervical four-five, Cervical five-six;  Surgeon: Earnie Larsson, MD;  Location: [facility];  Service: Neurosurgery;  Laterality: N/A;   CESAREAN SECTION     CHOLECYSTECTOMY  07/29/2012   Procedure: LAPAROSCOPIC CHOLECYSTECTOMY WITH INTRAOPERATIVE CHOLANGIOGRAM;  Surgeon: Adin Hector, MD;  Location: [facility];  Service: General;  Laterality: N/A;   neck pain     WISDOM TOOTH EXTRACTION  2016    History reviewed. No pertinent family history. Social History:  reports that she has never smoked. She has never used smokeless tobacco. She reports that she does not drink alcohol and does not use drugs.  Allergies: No Active Allergies  Medications Prior to Admission  Medication Sig Dispense Refill   amitriptyline (ELAVIL) 25 MG tablet Take 25-50 mg by mouth at bedtime as needed for sleep.     escitalopram (LEXAPRO) 20 MG tablet Take 20 mg by mouth in the morning.      HYDROcodone-acetaminophen (NORCO/VICODIN) 5-325 MG tablet Take 1 tablet by mouth in the morning and at bedtime.     hydrOXYzine (ATARAX) 25 MG tablet Take 25 mg by mouth 3 (three) times daily as needed for anxiety.     loratadine (CLARITIN) 10 MG tablet Take 10 mg by mouth in the morning.     losartan (COZAAR) 50 MG tablet Take 50 mg by mouth in the morning.     Omega-3 Fatty Acids (FISH OIL PO) Take 1,200 mg by mouth in the morning and at bedtime.     tiZANidine (ZANAFLEX) 2 MG tablet Take 2 mg by mouth 3 (three) times daily.     Vitamin D, Ergocalciferol, (DRISDOL) 1.25 MG (50000 UNIT) CAPS capsule Take 100,000 Units by mouth every Sunday.      Results for orders placed or performed during the hospital encounter of 03/30/22 (from the past 48 hour(s))  Pregnancy, urine POC     Status: None   Collection Time: 03/30/22  7:07 AM  Result Value Ref Range   Preg Test, Ur NEGATIVE NEGATIVE    Comment:        THE SENSITIVITY OF THIS METHODOLOGY IS >24 mIU/mL    No results found.  Pertinent items noted in HPI and remainder of comprehensive ROS otherwise negative.  Blood pressure (!) 155/107, pulse (!) 107, temperature 98.1 F (36.7 C), temperature source Oral, resp. rate 18, height '5\' 4"'$  (1.626 m), weight 99.8 kg, last menstrual period 03/20/2022,  SpO2 99 %.  Patient is awake and alert.  She is oriented and appropriate.  Speech is fluent.  Judgment insight are intact.  Cranial nerve function normal bilateral.  Motor examination reveals some mild grip strength weakness and intrinsic weakness in both hands.  Sensory examination with patchy distal sensory loss in both upper extremities.  Reflexes are brisk.  Hoffmann's responses are present.  Examination head ears eyes nose throat is unremarkable chest and abdomen are benign.  Extremities are free from injury or deformity. Assessment/Plan C3-4 stenosis with myelopathy.  Plan C3-4 anterior cervical discectomy and fusion with interbody cage, local  with autograft, and anterior placement patient.  Risks and benefits of been explained.  Patient wishes to proceed.  Cooper Render Sammie Schermerhorn 03/30/2022, 7:46 AM

## 2022-03-30 NOTE — Progress Notes (Signed)
Patient alert and oriented, voiding adequately, skin clean, dry and intact without evidence of skin break down, or symptoms of complications - no redness or edema noted, only slight tenderness at site.  Patient states pain is manageable at time of discharge. Patient has an appointment with MD in 2 weeks 

## 2022-03-30 NOTE — Discharge Instructions (Addendum)
Wound Care Keep incision covered and dry for 3 days.  Do not put any creams, lotions, or ointments on incision. Leave steri-strips on neck.  They will fall off by themselves. Activity Walk each and every day, increasing distance each day. No lifting greater than 5 lbs.  Avoid excessive neck motion. No driving for 2 weeks; may ride as a passenger locally. Wear neck brace when out of bed and riding in car Diet Resume your normal diet.   Call Your Doctor If Any of These Occur Redness, drainage, or swelling at the wound.  Temperature greater than 101 degrees. Severe pain not relieved by pain medication. Incision starts to come apart. Follow Up Appt Call today for appointment in 1-2 weeks ([phone]) or for problems.  If you have any hardware placed in your spine, you will need an x-ray before your appointment.

## 2022-03-30 NOTE — Progress Notes (Signed)
Orthopedic Tech Progress Note Patient Details:  CHAYA DEHAAN [birth year] [id number] Rn said patient has collar Patient ID: Murray Hodgkins, female   DOB: Apr 18, 1981, 41 y.o.   MRN: [id number]  Chip Boer 03/30/2022, 11:41 AM

## 2022-03-30 NOTE — Brief Op Note (Signed)
03/30/2022  9:30 AM  PATIENT:  Murray Hodgkins  41 y.o. female  PRE-OPERATIVE DIAGNOSIS:  Stenosis  POST-OPERATIVE DIAGNOSIS:  * No post-op diagnosis entered *  PROCEDURE:  Procedure(s) with comments: ACDF - C3-C4 (N/A) - 3C  SURGEON:  Surgeon(s) and Role:    * Earnie Larsson, MD - Primary  PHYSICIAN ASSISTANT:   ASSISTANTSMearl Latin   ANESTHESIA:   general  EBL:  50cc  BLOOD ADMINISTERED:none  DRAINS: none   LOCAL MEDICATIONS USED:  NONE  SPECIMEN:  No Specimen  DISPOSITION OF SPECIMEN:  N/A  COUNTS:  YES  TOURNIQUET:  * No tourniquets in log *  DICTATION: .Dragon Dictation  PLAN OF CARE: Admit for overnight observation  PATIENT DISPOSITION:  PACU - hemodynamically stable.   Delay start of Pharmacological VTE agent (>24hrs) due to surgical blood loss or risk of bleeding: yes

## 2022-03-30 NOTE — Progress Notes (Signed)
Has responded well to her narcs for pain, yet when she awakens begins to cry and hyperventilate. Does not respond well to  words of encouragement and attempts to calm her. Ha tolerated po flds without issues

## 2022-03-30 NOTE — Transfer of Care (Signed)
Immediate Anesthesia Transfer of Care Note  Patient: Tiffany Ho  Procedure(s) Performed: ACDF - C3-C4 (Neck)  Patient Location: PACU  Anesthesia Type:General  Level of Consciousness: awake, oriented and patient cooperative  Airway & Oxygen Therapy: Patient Spontanous Breathing and Patient connected to nasal cannula oxygen  Post-op Assessment: Report given to RN and Post -op Vital signs reviewed and stable  Post vital signs: Reviewed  Last Vitals:  Vitals Value Taken Time  BP 132/110 03/30/22 0945  Temp    Pulse 99 03/30/22 0946  Resp 14 03/30/22 0946  SpO2 97 % 03/30/22 0946  Vitals shown include unvalidated device data.  Last Pain:  Vitals:   03/30/22 0653  TempSrc:   PainSc: 8       Patients Stated Pain Goal: 0 (52/17/47 1595)  Complications: No notable events documented.

## 2022-03-30 NOTE — Plan of Care (Signed)

## 2022-03-30 NOTE — Anesthesia Procedure Notes (Signed)
Procedure Name: Intubation Date/Time: 03/30/2022 8:10 AM Performed by: Jenne Campus, CRNA Pre-anesthesia Checklist: Patient identified, Emergency Drugs available, Suction available and Patient being monitored Patient Re-evaluated:Patient Re-evaluated prior to induction Oxygen Delivery Method: Circle System Utilized Preoxygenation: Pre-oxygenation with 100% oxygen Induction Type: IV induction Ventilation: Mask ventilation without difficulty Laryngoscope Size: Glidescope and 3 Grade View: Grade I Tube type: Oral Tube size: 7.0 mm Number of attempts: 1 Airway Equipment and Method: Stylet and Oral airway Placement Confirmation: ETT inserted through vocal cords under direct vision, positive ETCO2 and breath sounds checked- equal and bilateral Secured at: 21 cm Tube secured with: Tape Dental Injury: Teeth and Oropharynx as per pre-operative assessment

## 2022-03-30 NOTE — Evaluation (Signed)
Occupational Therapy Evaluation Patient Details Name: Tiffany Ho MRN: [id number] DOB: Feb 04, 1981 Today's Date: 03/30/2022   History of Present Illness 41 yo female s/p C3-4 fusion. PMH including arthritis, carpal runnel, HTN, ACDF (8/92022), and anxiety.       Clinical Impression   PTA, pt was living with her boyfriend and her three sons and was independent; reporting she hasn't felt normal and has been very "twitchy". Currently, pt performing at Mod I level with increased time for ADLs and functional mobility. Provided education and handout on cervical precautions, UB ADLs, LB ADLs, toileting, and shower transfer; pt demonstrated understanding. Answered all pt questions. Recommend dc home once medically stable per physician. All acute OT needs met and will sign off. Thank you.    Recommendations for follow up therapy are one component of a multi-disciplinary discharge planning process, led by the attending physician.  Recommendations may be updated based on patient status, additional functional criteria and insurance authorization.   Follow Up Recommendations  No OT follow up    Assistance Recommended at Discharge PRN  Patient can return home with the following      Functional Status Assessment     Equipment Recommendations  None recommended by OT    Recommendations for Other Services       Precautions / Restrictions Precautions Precautions: Cervical Precaution Booklet Issued: Yes (comment) Precaution Comments: Reviewed handout in full Required Braces or Orthoses: Cervical Brace Cervical Brace: Soft collar;At all times Restrictions Weight Bearing Restrictions: No      Mobility Bed Mobility Overal bed mobility: Modified Independent             General bed mobility comments: cues for log roll    Transfers Overall transfer level: Modified independent                        Balance Overall balance assessment: No apparent balance deficits (not  formally assessed)                                         ADL either performed or assessed with clinical judgement   ADL Overall ADL's : Modified independent                                       General ADL Comments: Increased time and using compensatory tehcniques.     Vision         Perception     Praxis      Pertinent Vitals/Pain       Hand Dominance Right   Extremity/Trunk Assessment Upper Extremity Assessment Upper Extremity Assessment: Overall WFL for tasks assessed (reports tremors - feelsing twitchy)   Lower Extremity Assessment Lower Extremity Assessment: Overall WFL for tasks assessed   Cervical / Trunk Assessment Cervical / Trunk Assessment: Neck Surgery   Communication Communication Communication: No difficulties   Cognition Arousal/Alertness: Awake/alert Behavior During Therapy: Restless Overall Cognitive Status: Within Functional Limits for tasks assessed                                 General Comments: Following commands and demonstrating understanding of education. Reporting that she feels "twitchy" and this has been going on since her laast neck sx. States  she can stop her muscles from twitching. Pt restless     General Comments  boyfriend and oldest son present    Exercises     Shoulder Instructions      Home Living Family/patient expects to be discharged to:: Private residence Living Arrangements: Spouse/significant other;Children (boyfriend and 3 sons) Available Help at Discharge: Family Type of Home: Mobile home Home Access: Stairs to enter Entrance Stairs-Number of Steps: 5 Entrance Stairs-Rails: Right;Left;Can reach both Home Layout: One level     Bathroom Shower/Tub: Tub/shower unit;Walk-in Psychologist, prison and probation services: Standard     Home Equipment: None (DME from her mom she can get)          Prior Functioning/Environment Prior Level of Function : Independent/Modified  Independent                        OT Problem List: Decreased activity tolerance;Decreased knowledge of precautions;Decreased knowledge of use of DME or AE      OT Treatment/Interventions:      OT Goals(Current goals can be found in the care plan section) Acute Rehab OT Goals Patient Stated Goal: Stop shaking OT Goal Formulation: All assessment and education complete, DC therapy  OT Frequency:      Co-evaluation              AM-PAC OT "6 Clicks" Daily Activity     Outcome Measure Help from another person eating meals?: None Help from another person taking care of personal grooming?: None Help from another person toileting, which includes using toliet, bedpan, or urinal?: None Help from another person bathing (including washing, rinsing, drying)?: None Help from another person to put on and taking off regular upper body clothing?: None Help from another person to put on and taking off regular lower body clothing?: None 6 Click Score: 24   End of Session Equipment Utilized During Treatment: Cervical collar Nurse Communication: Mobility status  Activity Tolerance: Patient tolerated treatment well Patient left: in bed;with call bell/phone within reach;with nursing/sitter in room;with family/visitor present  OT Visit Diagnosis: Unsteadiness on feet (R26.81);Other abnormalities of gait and mobility (R26.89);Muscle weakness (generalized) (M62.81)                Time: 9485-4627 OT Time Calculation (min): 25 min Charges:  OT General Charges $OT Visit: 1 Visit OT Evaluation $OT Eval Low Complexity: 1 Low OT Treatments $Self Care/Home Management : 8-22 mins  Ieshia Hatcher MSOT, OTR/L Acute Rehab Pager: [phone] Office: [place] 03/30/2022, 5:02 PM

## 2022-03-31 ENCOUNTER — Encounter (HOSPITAL_COMMUNITY): Payer: Self-pay | Admitting: Neurosurgery

## 2022-04-07 NOTE — Telephone Encounter (Addendum)
I s/w the pt and I first apologized that I was only given this message yesterday. Pt is agreeable to Itamar sleep study. She will come in Tuesday 04/11/22 @ 3 pm. Pt aware she will stop at our front desk check I and let them know that she is here to be set up for a home sleep study. I did tell the pt that I will not be in the office that day, so she will need to ask to see Jonette Eva or Rico Junker.   I assured the pt that I will also let the front desk know that she is coming in and who they need to call  Claiborne Billings, [place] or Rico Junker.). Does look like a STOPBANG needs to be done as well. Pt is approved so she may be given the PIN at time of set up.   Pt thanked me for the call and the help today. I again, did apologize to her that our office dropped the ball.   I will forward this to the names mentioned above that the pt will be coming in for Itamar set up.

## 2022-04-11 ENCOUNTER — Encounter: Payer: Self-pay | Admitting: Cardiology

## 2022-04-11 ENCOUNTER — Encounter (INDEPENDENT_AMBULATORY_CARE_PROVIDER_SITE_OTHER): Payer: Medicaid Other | Admitting: Cardiology

## 2022-04-11 DIAGNOSIS — G4733 Obstructive sleep apnea (adult) (pediatric): Secondary | ICD-10-CM

## 2022-04-11 NOTE — Telephone Encounter (Signed)
Patient present to the office for Itamar setup. Reviewed instructions and completed Stopbang (5). Made the patient aware that she may proceed with the [facility] Sleep Study since it has already been approved. PIN # provided to the patient. Patient made aware that she will be contacted after the test has been read with the results and any recommendations. Patient verbalized understanding.

## 2022-04-12 ENCOUNTER — Other Ambulatory Visit: Payer: Medicaid Other

## 2022-04-12 DIAGNOSIS — Z6837 Body mass index (BMI) 37.0-37.9, adult: Secondary | ICD-10-CM

## 2022-04-12 DIAGNOSIS — I10 Essential (primary) hypertension: Secondary | ICD-10-CM

## 2022-04-12 DIAGNOSIS — R0600 Dyspnea, unspecified: Secondary | ICD-10-CM

## 2022-04-12 NOTE — Procedures (Signed)
   SLEEP STUDY REPORT Patient Information Study Date: 04/11/22 Patient Name: Tiffany Ho Patient ID: [id number] Birth Date: 04/30/1981 Age: 41 Gender: Female BMI: 36.9 (W=216 lb, H=5' 4'') Referring Physician: Lenna Sciara, MD  TEST DESCRIPTION: Home sleep apnea testing was completed using the WatchPat, a Type 1 device, utilizing peripheral arterial tonometry (PAT), chest movement, actigraphy, pulse oximetry, pulse rate, body position and snore. AHI was calculated with apnea and hypopnea using valid sleep time as the denominator. RDI includes apneas, hypopneas, and RERAs. The data acquired and the scoring of sleep and all associated events were performed in accordance with the recommended standards and specifications as outlined in the AASM Manual for the Scoring of Sleep and Associated Events 2.2.0 (2015).  FINDINGS: 1. Mild Obstructive Sleep Apnea with AHI /7.4hr. 2. No Central Sleep Apnea with pAHIc 0.1/hr. 3. Oxygen desaturations as low as 79%. 4. Mild snoring was present. O2 sats were < 88% for min. 5. Total sleep time was 7 hrs and 18 min. 6. 24.9% of total sleep time was spent in REM sleep. 7. Normal sleep onset latency at 17 min. 8. Normal REM sleep onset latency at 71 min. 9. Total awakenings were 4.  DIAGNOSIS: Mild Obstructive Sleep Apnea (G47.33)  RECOMMENDATIONS: 1. Clinical correlation of these findings is necessary. The decision to treat obstructive sleep apnea (OSA) is usually based on the presence of apnea symptoms or the presence of associated medical conditions such as Hypertension, Congestive Heart Failure, Atrial Fibrillation or Obesity. The most common symptoms of OSA are snoring, gasping for breath while sleeping, daytime sleepiness and fatigue.  2. Initiating apnea therapy is recommended given the presence of symptoms and/or associated conditions. Recommend proceeding with one of the following:   a. Auto-CPAP therapy with a pressure range of  5-20cm H2O.   b. An oral appliance (OA) that can be obtained from certain dentists with expertise in sleep medicine. These are primarily of use in non-obese patients with mild and moderate disease.   c. An ENT consultation which may be useful to look for specific causes of obstruction and possible treatment options.   d. If patient is intolerant to PAP therapy, consider referral to ENT for evaluation for hypoglossal nerve stimulator.  3. Close follow-up is necessary to ensure success with CPAP or oral appliance therapy for maximum benefit .  4. A follow-up oximetry study on CPAP is recommended to assess the adequacy of therapy and determine the need for supplemental oxygen or the potential need for Bi-level therapy. An arterial blood gas to determine the adequacy of baseline ventilation and oxygenation should also be considered.  5. Healthy sleep recommendations include: adequate nightly sleep (normal 7-9 hrs/night), avoidance of caffeine after noon and alcohol near bedtime, and maintaining a sleep environment that is cool, dark and quiet.  6. Weight loss for overweight patients is recommended. Even modest amounts of weight loss can significantly improve the severity of sleep apnea.  7. Snoring recommendations include: weight loss where appropriate, side sleeping, and avoidance of alcohol before bed.  8. Operation of motor vehicle should be avoided when sleepy.  Signature: Electronically Signed: 04/12/22 Fransico Him, MD; [facility]; [place], American Board of Sleep Medicine

## 2022-04-14 NOTE — Procedures (Signed)
Erroneous encounter

## 2022-04-14 NOTE — Progress Notes (Signed)
This encounter was created in error - please disregard.

## 2022-04-20 ENCOUNTER — Telehealth: Payer: Self-pay | Admitting: *Deleted

## 2022-04-20 NOTE — Telephone Encounter (Signed)
Called results lmtcb. 

## 2022-04-20 NOTE — Telephone Encounter (Signed)
Tiffany Margarita, MD  Freada Bergeron, CMA; Lauralee Evener, [place] about that I clicked the wrong smart phrase.  Please have her come in virtual for discussion of treatment options      From: Freada Bergeron, CMA  Sent: 04/20/2022  12:55 PM EDT  To: Tiffany Margarita, MD   This patient had a itamar study not a titration what result should I give her. Please advise

## 2022-04-20 NOTE — Telephone Encounter (Signed)
-----   Message from Lauralee Evener, [place] sent at 04/14/2022  9:23 AM EDT -----  ----- Message ----- From: Sueanne Margarita, MD Sent: 04/12/2022   6:59 PM EDT To: Cv Div Sleep Studies  Please let patient know that they have sleep apnea with successful PAP titration.  PAP ordered placed in Epic.  Followup in 6 weeks after starting PAP therapy

## 2022-05-03 NOTE — Telephone Encounter (Signed)
05/03/22 LVM to discuss treatment options.

## 2022-05-12 NOTE — Telephone Encounter (Signed)
05/12/22 LVM to schedule appt to discuss treatment options - LCN

## 2022-06-23 ENCOUNTER — Other Ambulatory Visit (HOSPITAL_COMMUNITY): Payer: Self-pay

## 2022-06-23 DIAGNOSIS — R1310 Dysphagia, unspecified: Secondary | ICD-10-CM

## 2022-06-30 ENCOUNTER — Encounter (HOSPITAL_COMMUNITY): Payer: Self-pay

## 2022-06-30 ENCOUNTER — Ambulatory Visit (HOSPITAL_COMMUNITY): Payer: Medicaid Other

## 2022-06-30 ENCOUNTER — Ambulatory Visit (HOSPITAL_COMMUNITY): Admission: RE | Admit: 2022-06-30 | Payer: Medicaid Other | Source: Ambulatory Visit

## 2022-07-05 ENCOUNTER — Institutional Professional Consult (permissible substitution): Payer: Self-pay | Admitting: Student

## 2022-07-18 ENCOUNTER — Ambulatory Visit (HOSPITAL_COMMUNITY)
Admission: RE | Admit: 2022-07-18 | Discharge: 2022-07-18 | Disposition: A | Discharge status: Home / Self Care | Payer: Medicaid Other | Source: Ambulatory Visit | Attending: Gastroenterology | Admitting: Gastroenterology

## 2022-07-18 ENCOUNTER — Ambulatory Visit (HOSPITAL_COMMUNITY)
Admission: RE | Admit: 2022-07-18 | Discharge: 2022-07-18 | Disposition: A | Discharge status: Home / Self Care | Payer: Medicaid Other | Source: Ambulatory Visit

## 2022-07-18 DIAGNOSIS — R1310 Dysphagia, unspecified: Secondary | ICD-10-CM

## 2022-07-18 DIAGNOSIS — R1314 Dysphagia, pharyngoesophageal phase: Secondary | ICD-10-CM | POA: Insufficient documentation

## 2022-07-18 NOTE — Progress Notes (Signed)
Speech Pathology - OP MBS study:   Pt presents with a suspected primary esophageal dysphagia. Monitor was visible to pt so that we could discuss MBS in real time and use imaging for feedback. Oral and pharyngeal function were WNL. There was consistent laryngeal/vestibule closure and no penetration/aspiration. There was normal bolus clearance through pharynx.  Intermittent scans of the esophagus revealed barium status. Pt pointed to her throat throughout study, indicating where she felt material was lodging; she was able to visualize absence of material in throat and we talked about referred sensation from the esophagus.  Consumption of a breakfast bar led confirmed Tiffany Ho's symptoms - there was retention of material in esophagus followed by her c/o discomfort and immediate regurgitation.  We discussed the esophagus as being the likely source of her condition. She was provided with a handout showing basic anatomy.   Recommendations included:  1) f/u with Dr. Benson Norway to further evaluate the esophagus;  2) eliminate tough/hard foods from diet and focus on soft/moist foods, smoothies, yogurt. Utilize her blender to liquify foods when able and pending GI f/u;  3) use what she has learned about her situation to help abate anxiety when possible - remind herself during an event that the food is in her esophagus, not her airway; that she is not having a cardiac event, and that the situation is temporary.   4) try neck/shoulder massage to help with the tension she is experiencing after regurgitation events and likely prior to events when she is having an anticipatory response.   Recommendations were repeated and provided in written form.  She was offered encouragement. She was pleased that Dr. Benson Norway made this referral and we agreed that there are concrete reasons to support her current condition.   No SLP f/u is needed.  Tiffany Enderle L. Tivis Ringer, MA CCC/SLP Clinical Specialist - [place] Office number [phone]

## 2022-07-21 ENCOUNTER — Institutional Professional Consult (permissible substitution): Payer: Self-pay | Admitting: Student

## 2022-07-24 NOTE — Progress Notes (Signed)
SLEEP MEDICINE VIRTUAL CONSULT NOTE via Video Note   Because of Jasime J Dikes's co-morbid illnesses, she is at least at moderate risk for complications without adequate follow up.  This format is felt to be most appropriate for this patient at this time.  All issues noted in this document were discussed and addressed.  A limited physical exam was performed with this format.  Please refer to the patient's chart for her consent to telehealth for [facility].      Date:  07/25/2022   ID:  Judson Roch, DOB Feb 03, 1981, MRN [id number] The patient was identified using 2 identifiers.  Patient Location: Home Provider Location: Home Office   PCP:  [facility] Providers Cardiologist:  None     Evaluation Performed:  Consultation - Judson Roch was referred by OSA for the evaluation of Alverda Skeans, MD.  Chief Complaint:  OSA  History of Present Illness:    BERNYCE PUNCHES is a 41 y.o. female who is being seen today for the evaluation of OSA at the request of Alverda Skeans, MD.  BAREERAH AMRHEIN is a 41 y.o. female with a hx of HTN, panic attacks, anxiety who was referred for HST due to HTN and obesity.  She underwent HST showing mild OSA with an AHI of 7.4/hr.  SHe is now here to discuss results of sleep study.  She tells me that she feels tired when she wakes up in the am and feels sleepy throughout the day.  She says that her kids have told her snoring rarely.  She has been told that she struggles to breathe in her sleep as well.    Past Medical History:  Diagnosis Date   Anxiety    Arthritis    degenerative disc disease and carpal tunnel bilateral hands   Carpal tunnel syndrome, bilateral    Cervical radiculopathy    Constipation    Diaphoresis    Gallstones    Hair loss disorder    Hypertension    Malaise and fatigue    Panic attacks    Seasonal allergies    SOB (shortness of breath)     Past Surgical History:  Procedure Laterality Date   ANTERIOR CERVICAL DECOMP/DISCECTOMY FUSION N/A 06/14/2021   Procedure: Anterior Cervical Decompression Discectomy Fusion Cervical four-five, Cervical five-six;  Surgeon: Julio Sicks, MD;  Location: [facility] OR;  Service: Neurosurgery;  Laterality: N/A;   ANTERIOR CERVICAL DECOMP/DISCECTOMY FUSION N/A 03/30/2022   Procedure: ACDF - C3-C4;  Surgeon: Julio Sicks, MD;  Location: [facility] OR;  Service: Neurosurgery;  Laterality: N/A;  3C   CESAREAN SECTION     CHOLECYSTECTOMY  07/29/2012   Procedure: LAPAROSCOPIC CHOLECYSTECTOMY WITH INTRAOPERATIVE CHOLANGIOGRAM;  Surgeon: Ernestene Mention, MD;  Location: [facility] OR;  Service: General;  Laterality: N/A;   neck pain     WISDOM TOOTH EXTRACTION  2016     Current Meds  Medication Sig   amitriptyline (ELAVIL) 25 MG tablet Take 25-50 mg by mouth at bedtime as needed for sleep.   escitalopram (LEXAPRO) 20 MG tablet Take 20 mg by mouth in the morning.   HYDROcodone-acetaminophen (NORCO) 10-325 MG tablet Take 1-2 tablets by mouth every 4 (four) hours as needed for severe pain ((score 7 to 10)).   HYDROcodone-acetaminophen (NORCO/VICODIN) 5-325 MG tablet Take 1 tablet by mouth in the morning and at bedtime.   hydrOXYzine (ATARAX) 25 MG tablet Take 25 mg by  mouth 3 (three) times daily as needed for anxiety.   loratadine (CLARITIN) 10 MG tablet Take 10 mg by mouth in the morning.   losartan (COZAAR) 50 MG tablet Take 50 mg by mouth in the morning.   Omega-3 Fatty Acids (FISH OIL PO) Take 1,200 mg by mouth in the morning and at bedtime.   tiZANidine (ZANAFLEX) 2 MG tablet Take 2 mg by mouth 3 (three) times daily.   tiZANidine (ZANAFLEX) 2 MG tablet Take 1 tablet (2 mg total) by mouth 3 (three) times daily.   Vitamin D, Ergocalciferol, (DRISDOL) 1.25 MG (50000 UNIT) CAPS capsule Take 100,000 Units by mouth every Sunday.     Allergies:   Patient has no active allergies.   Social History   Tobacco Use   Smoking  status: Never   Smokeless tobacco: Never  Vaping Use   Vaping Use: Never used  Substance Use Topics   Alcohol use: No   Drug use: No     Family Hx: The patient's family history is not on file.  ROS:   Please see the history of present illness.     All other systems reviewed and are negative.   Prior Sleep studies:   The following studies were reviewed today:  Home sleep study  Labs/Other Tests and Data Reviewed:     Recent Labs: 03/23/2022: BUN 9; Creatinine, Ser 0.71; Hemoglobin 13.3; Platelets 404; Potassium 4.0; Sodium 138    Wt Readings from Last 3 Encounters:  07/25/22 224 lb (101.6 kg)  03/30/22 220 lb (99.8 kg)  03/23/22 219 lb 3.2 oz (99.4 kg)     Risk Assessment/Calculations:      STOP-Bang Score:  5      Objective:    Vital Signs:  BP (!) 145/95   Ht 5\' 4"  (1.626 m)   Wt 224 lb (101.6 kg)   LMP 07/14/2022 (Exact Date)   BMI 38.45 kg/m    VITAL SIGNS:  reviewed GEN:  no acute distress EYES:  sclerae anicteric, EOMI - Extraocular Movements Intact RESPIRATORY:  normal respiratory effort, symmetric expansion CARDIOVASCULAR:  no peripheral edema SKIN:  no rash, lesions or ulcers. MUSCULOSKELETAL:  no obvious deformities. NEURO:  alert and oriented x 3, no obvious focal deficit PSYCH:  normal affect  ASSESSMENT & PLAN:    OSA  -very mild by HST with AHI 7.4/hr -she does have excessive daytime sleepiness -we discussed treatment options including CPAP and oral device -the oral device is cost prohibitive -I have recommended a trial of auto CPAP from 4 to 15cm H2O with heated humidity and mask of choice -she will see me back 6 weeks after getting her device  HTN -BP elevated on exam today -continue prescription drug management with Losartan 50mg  daily with PRN refills  Time:   Today, I have spent 15 minutes with the patient with telehealth technology discussing the above problems.     Medication Adjustments/Labs and Tests Ordered: Current  medicines are reviewed at length with the patient today.  Concerns regarding medicines are outlined above.   Tests Ordered: No orders of the defined types were placed in this encounter.   Medication Changes: No orders of the defined types were placed in this encounter.   Follow Up:  In Person  6 weeks after getting her device  Signed, Armanda Magic, MD  07/25/2022 8:31 AM    [facility]

## 2022-07-25 ENCOUNTER — Encounter: Payer: Self-pay | Admitting: Cardiology

## 2022-07-25 ENCOUNTER — Ambulatory Visit: Payer: Medicaid Other | Attending: Cardiology | Admitting: Cardiology

## 2022-07-25 VITALS — BP 145/95 | Ht 64.0 in | Wt 224.0 lb

## 2022-07-25 DIAGNOSIS — G4733 Obstructive sleep apnea (adult) (pediatric): Secondary | ICD-10-CM | POA: Diagnosis not present

## 2022-07-25 NOTE — Addendum Note (Signed)
Addended by: Sueanne Margarita on: 07/25/2022 08:54 AM   Modules accepted: Level of Service

## 2022-07-26 ENCOUNTER — Telehealth: Payer: Self-pay | Admitting: *Deleted

## 2022-07-26 DIAGNOSIS — G4733 Obstructive sleep apnea (adult) (pediatric): Secondary | ICD-10-CM

## 2022-07-26 NOTE — Telephone Encounter (Signed)
DME selection is [facility]. Patient understands he will be contacted by [facility] to set up his cpap. Patient understands to call if [facility] does not contact him with new setup in a timely manner. Patient understands they will be called once confirmation has been received from [facility]/ that they have received their new machine to schedule 10 week follow up appointment.   [facility] notified of new cpap order  Please add to airview Patient was grateful for the call and thanked me. 

## 2022-07-26 NOTE — Telephone Encounter (Signed)
-----   Message from Antonieta Iba, RN sent at 07/25/2022  9:23 AM EDT ----- Per Dr. Radford Pax: auto CPAP from 4 to 15cm H2O with heated humidity and mask of choice -she will see me back 6 weeks after getting her device Thanks!

## 2022-08-07 ENCOUNTER — Institutional Professional Consult (permissible substitution): Payer: Medicaid Other | Admitting: Pulmonary Disease

## 2022-08-09 ENCOUNTER — Other Ambulatory Visit: Payer: Self-pay | Admitting: Neurosurgery

## 2022-08-29 ENCOUNTER — Encounter (HOSPITAL_COMMUNITY): Payer: Self-pay | Admitting: Neurosurgery

## 2022-08-29 ENCOUNTER — Other Ambulatory Visit: Payer: Self-pay

## 2022-08-29 NOTE — Progress Notes (Signed)
Tiffany Ho denies chest pain or shortness of breath. Patient denies having any s/s of Covid in her household, also denies any known exposure to Covid.   Tiffany Ho 's PCP is Dr. Clyde Lundborg.  Tiffany Ho saw Dr. Ashok Norris and evaluated for sleep apnea, she has a CPAP and wears it every night.

## 2022-08-30 ENCOUNTER — Encounter (HOSPITAL_COMMUNITY)
Admission: RE | Disposition: A | Discharge status: Home / Self Care | Payer: Self-pay | Source: Ambulatory Visit | Attending: Neurosurgery

## 2022-08-30 ENCOUNTER — Ambulatory Visit (HOSPITAL_COMMUNITY): Payer: Medicaid Other | Admitting: Certified Registered Nurse Anesthetist

## 2022-08-30 ENCOUNTER — Other Ambulatory Visit: Payer: Self-pay

## 2022-08-30 ENCOUNTER — Ambulatory Visit (HOSPITAL_BASED_OUTPATIENT_CLINIC_OR_DEPARTMENT_OTHER): Payer: Medicaid Other | Admitting: Certified Registered Nurse Anesthetist

## 2022-08-30 ENCOUNTER — Ambulatory Visit (HOSPITAL_COMMUNITY)
Admission: RE | Admit: 2022-08-30 | Discharge: 2022-08-30 | Disposition: A | Discharge status: Home / Self Care | Payer: Medicaid Other | Source: Ambulatory Visit | Attending: Neurosurgery | Admitting: Neurosurgery

## 2022-08-30 DIAGNOSIS — I10 Essential (primary) hypertension: Secondary | ICD-10-CM

## 2022-08-30 DIAGNOSIS — K219 Gastro-esophageal reflux disease without esophagitis: Secondary | ICD-10-CM | POA: Diagnosis not present

## 2022-08-30 DIAGNOSIS — G5601 Carpal tunnel syndrome, right upper limb: Secondary | ICD-10-CM | POA: Diagnosis not present

## 2022-08-30 DIAGNOSIS — G5603 Carpal tunnel syndrome, bilateral upper limbs: Secondary | ICD-10-CM | POA: Insufficient documentation

## 2022-08-30 HISTORY — DX: Sleep apnea, unspecified: G47.30

## 2022-08-30 HISTORY — PX: CARPAL TUNNEL RELEASE: SHX101

## 2022-08-30 HISTORY — DX: Depression, unspecified: F32.A

## 2022-08-30 HISTORY — DX: Gastro-esophageal reflux disease without esophagitis: K21.9

## 2022-08-30 LAB — BASIC METABOLIC PANEL
Anion gap: 7 (ref 5–15)
BUN: 14 mg/dL (ref 6–20)
CO2: 23 mmol/L (ref 22–32)
Calcium: 8.7 mg/dL — ABNORMAL LOW (ref 8.9–10.3)
Chloride: 109 mmol/L (ref 98–111)
Creatinine, Ser: 0.78 mg/dL (ref 0.44–1.00)
GFR, Estimated: >60 mL/min (ref >60)
Glucose, Bld: 121 mg/dL — ABNORMAL HIGH (ref 70–99)
Potassium: 3.7 mmol/L (ref 3.5–5.1)
Sodium: 139 mmol/L (ref 135–145)

## 2022-08-30 LAB — CBC
HCT: 36.3 % (ref 36.0–46.0)
Hemoglobin: 12.2 g/dL (ref 12.0–15.0)
MCH: 29.8 pg (ref 26.0–34.0)
MCHC: 33.6 g/dL (ref 30.0–36.0)
MCV: 88.5 fL (ref 80.0–100.0)
Platelets: 458 10*3/uL — ABNORMAL HIGH (ref 150–400)
RBC: 4.1 MIL/uL (ref 3.87–5.11)
RDW: 12.8 % (ref 11.5–15.5)
WBC: 6.2 10*3/uL (ref 4.0–10.5)
nRBC: 0 % (ref 0.0–0.2)

## 2022-08-30 LAB — PREGNANCY, URINE: Preg Test, Ur: NEGATIVE

## 2022-08-30 SURGERY — CARPAL TUNNEL RELEASE
Anesthesia: Monitor Anesthesia Care | Laterality: Right

## 2022-08-30 MED ORDER — ORAL CARE MOUTH RINSE: 15.0000 mL | Freq: Once | OROMUCOSAL | Status: AC

## 2022-08-30 MED ORDER — FENTANYL CITRATE (PF) 250 MCG/5ML IJ SOLN
INTRAMUSCULAR | Status: AC
  Filled 2022-08-30: qty 5

## 2022-08-30 MED ORDER — LACTATED RINGERS IV SOLN: INTRAVENOUS | Status: DC

## 2022-08-30 MED ORDER — PROPOFOL 10 MG/ML IV BOLUS
INTRAVENOUS | Status: DC | PRN
  Administered 2022-08-30: 10 mg via INTRAVENOUS
  Administered 2022-08-30: 30 mg via INTRAVENOUS

## 2022-08-30 MED ORDER — ONDANSETRON HCL 4 MG/2ML IJ SOLN
INTRAMUSCULAR | Status: DC | PRN
  Administered 2022-08-30: 4 mg via INTRAVENOUS

## 2022-08-30 MED ORDER — FENTANYL CITRATE (PF) 100 MCG/2ML IJ SOLN: 25.0000 ug | INTRAMUSCULAR | Status: DC | PRN

## 2022-08-30 MED ORDER — KETOROLAC TROMETHAMINE 30 MG/ML IJ SOLN: 30.0000 mg | Freq: Once | INTRAMUSCULAR | Status: DC | PRN

## 2022-08-30 MED ORDER — PROPOFOL 500 MG/50ML IV EMUL
INTRAVENOUS | Status: DC | PRN
  Administered 2022-08-30: 100 ug/kg/min via INTRAVENOUS

## 2022-08-30 MED ORDER — PROPOFOL 1000 MG/100ML IV EMUL
INTRAVENOUS | Status: AC
  Filled 2022-08-30: qty 100

## 2022-08-30 MED ORDER — MIDAZOLAM HCL 2 MG/2ML IJ SOLN
INTRAMUSCULAR | Status: DC | PRN
  Administered 2022-08-30: 2 mg via INTRAVENOUS

## 2022-08-30 MED ORDER — ONDANSETRON HCL 4 MG/2ML IJ SOLN: 4.0000 mg | Freq: Once | INTRAMUSCULAR | Status: DC | PRN

## 2022-08-30 MED ORDER — FENTANYL CITRATE (PF) 250 MCG/5ML IJ SOLN
INTRAMUSCULAR | Status: DC | PRN
  Administered 2022-08-30: 100 ug via INTRAVENOUS

## 2022-08-30 MED ORDER — ONDANSETRON HCL 4 MG/2ML IJ SOLN
INTRAMUSCULAR | Status: AC
  Filled 2022-08-30: qty 2

## 2022-08-30 MED ORDER — CEFAZOLIN SODIUM-DEXTROSE 2-4 GM/100ML-% IV SOLN
2.0000 g | INTRAVENOUS | Status: AC
  Administered 2022-08-30: 2 g via INTRAVENOUS
  Filled 2022-08-30: qty 100

## 2022-08-30 MED ORDER — PROPOFOL 10 MG/ML IV BOLUS
INTRAVENOUS | Status: AC
  Filled 2022-08-30: qty 20

## 2022-08-30 MED ORDER — CHLORHEXIDINE GLUCONATE CLOTH 2 % EX PADS
6.0000 | MEDICATED_PAD | Freq: Once | CUTANEOUS | Status: AC
  Administered 2022-08-30: 6 via TOPICAL

## 2022-08-30 MED ORDER — DEXMEDETOMIDINE HCL IN NACL 80 MCG/20ML IV SOLN
INTRAVENOUS | Status: DC | PRN
  Administered 2022-08-30: 12 ug via BUCCAL
  Administered 2022-08-30 (×2): 8 ug via BUCCAL
  Administered 2022-08-30: 12 ug via BUCCAL

## 2022-08-30 MED ORDER — OXYCODONE HCL 5 MG/5ML PO SOLN: 5.0000 mg | Freq: Once | ORAL | Status: AC | PRN

## 2022-08-30 MED ORDER — LIDOCAINE-EPINEPHRINE 1 %-1:100000 IJ SOLN
INTRAMUSCULAR | Status: DC | PRN
  Administered 2022-08-30: 5 mL

## 2022-08-30 MED ORDER — CHLORHEXIDINE GLUCONATE 0.12 % MT SOLN
15.0000 mL | Freq: Once | OROMUCOSAL | Status: AC
  Administered 2022-08-30: 15 mL via OROMUCOSAL
  Filled 2022-08-30: qty 15

## 2022-08-30 MED ORDER — OXYCODONE HCL 5 MG PO TABS
ORAL_TABLET | ORAL | Status: AC
  Administered 2022-08-30: 5 mg via ORAL
  Filled 2022-08-30: qty 1

## 2022-08-30 MED ORDER — FENTANYL CITRATE (PF) 100 MCG/2ML IJ SOLN: INTRAMUSCULAR | Status: DC | PRN

## 2022-08-30 MED ORDER — CHLORHEXIDINE GLUCONATE CLOTH 2 % EX PADS: 6.0000 | MEDICATED_PAD | Freq: Once | CUTANEOUS | Status: DC

## 2022-08-30 MED ORDER — MIDAZOLAM HCL 2 MG/2ML IJ SOLN
INTRAMUSCULAR | Status: AC
  Filled 2022-08-30: qty 2

## 2022-08-30 MED ORDER — LIDOCAINE-EPINEPHRINE 1 %-1:100000 IJ SOLN
INTRAMUSCULAR | Status: AC
  Filled 2022-08-30: qty 1

## 2022-08-30 MED ORDER — OXYCODONE HCL 5 MG PO TABS: 5.0000 mg | ORAL_TABLET | Freq: Once | ORAL | Status: AC | PRN

## 2022-08-30 MED ORDER — DEXMEDETOMIDINE HCL IN NACL 80 MCG/20ML IV SOLN
INTRAVENOUS | Status: AC
  Filled 2022-08-30: qty 20

## 2022-08-30 SURGICAL SUPPLY — 40 items
BAG COUNTER SPONGE SURGICOUNT (BAG) ×2 IMPLANT
BAG DECANTER FOR FLEXI CONT (MISCELLANEOUS) ×2 IMPLANT
BAG SPNG CNTER NS LX DISP (BAG) ×1
BLADE SURG 10 STRL SS (BLADE) IMPLANT
BLADE SURG 15 STRL LF DISP TIS (BLADE) ×2 IMPLANT
BLADE SURG 15 STRL SS (BLADE) ×1
BNDG ELASTIC 3X5.8 VLCR STR LF (GAUZE/BANDAGES/DRESSINGS) ×2 IMPLANT
CABLE BIPOLOR RESECTION CORD (MISCELLANEOUS) ×2 IMPLANT
CANISTER SUCT 3000ML PPV (MISCELLANEOUS) ×2 IMPLANT
DRAPE EXTREMITY T 121X128X90 (DISPOSABLE) ×2 IMPLANT
DRAPE HALF SHEET 40X57 (DRAPES) ×2 IMPLANT
GAUZE 4X4 16PLY ~~LOC~~+RFID DBL (SPONGE) ×2 IMPLANT
GAUZE SPONGE 4X4 12PLY STRL (GAUZE/BANDAGES/DRESSINGS) ×2 IMPLANT
GLOVE BIOGEL PI IND STRL 7.0 (GLOVE) IMPLANT
GLOVE BIOGEL PI IND STRL 7.5 (GLOVE) IMPLANT
GLOVE ECLIPSE 6.5 STRL STRAW (GLOVE) IMPLANT
GLOVE ECLIPSE 7.0 STRL STRAW (GLOVE) IMPLANT
GLOVE ECLIPSE 9.0 STRL (GLOVE) ×2 IMPLANT
GLOVE EXAM NITRILE XL STR (GLOVE) IMPLANT
GOWN STRL REUS W/ TWL LRG LVL3 (GOWN DISPOSABLE) IMPLANT
GOWN STRL REUS W/ TWL XL LVL3 (GOWN DISPOSABLE) IMPLANT
GOWN STRL REUS W/TWL 2XL LVL3 (GOWN DISPOSABLE) IMPLANT
GOWN STRL REUS W/TWL LRG LVL3 (GOWN DISPOSABLE) ×2
GOWN STRL REUS W/TWL XL LVL3 (GOWN DISPOSABLE) ×1
KIT BASIN OR (CUSTOM PROCEDURE TRAY) ×2 IMPLANT
KIT TURNOVER KIT B (KITS) ×2 IMPLANT
NDL HYPO 25X1 1.5 SAFETY (NEEDLE) ×2 IMPLANT
NEEDLE HYPO 25X1 1.5 SAFETY (NEEDLE) ×1 IMPLANT
NS IRRIG 1000ML POUR BTL (IV SOLUTION) ×2 IMPLANT
PACK BASIC III (CUSTOM PROCEDURE TRAY) ×1
PACK SRG BSC III STRL LF ECLPS (CUSTOM PROCEDURE TRAY) ×2 IMPLANT
STOCKINETTE 4X48 STRL (DRAPES) ×2 IMPLANT
SUT ETHILON 4 0 PS 2 18 (SUTURE) ×2 IMPLANT
SUT VICRYL 4-0 PS2 18IN ABS (SUTURE) ×2 IMPLANT
SYR BULB EAR ULCER 3OZ GRN STR (SYRINGE) ×2 IMPLANT
SYR CONTROL 10ML LL (SYRINGE) ×2 IMPLANT
TOWEL GREEN STERILE (TOWEL DISPOSABLE) ×2 IMPLANT
TOWEL GREEN STERILE FF (TOWEL DISPOSABLE) ×2 IMPLANT
UNDERPAD 30X36 HEAVY ABSORB (UNDERPADS AND DIAPERS) ×2 IMPLANT
WATER STERILE IRR 1000ML POUR (IV SOLUTION) ×2 IMPLANT

## 2022-08-30 NOTE — Transfer of Care (Signed)
Immediate Anesthesia Transfer of Care Note  Patient: Murray Hodgkins  Procedure(s) Performed: Carpal Tunnel Release - right (Right)  Patient Location: PACU  Anesthesia Type:MAC  Level of Consciousness: awake, alert  and oriented  Airway & Oxygen Therapy: Patient Spontanous Breathing  Post-op Assessment: Report given to RN and Post -op Vital signs reviewed and stable  Post vital signs: Reviewed and stable  Last Vitals:  Vitals Value Taken Time  BP 104/66 08/30/22 0853  Temp    Pulse 77 08/30/22 0856  Resp 19 08/30/22 0856  SpO2 96 % 08/30/22 0856  Vitals shown include unvalidated device data.  Last Pain:  Vitals:   08/30/22 0370  TempSrc:   PainSc: 0-No pain         Complications: No notable events documented.

## 2022-08-30 NOTE — H&P (Signed)
Tiffany Ho is an 41 y.o. female.   Chief Complaint: Numbness HPI: 41 year old female with history of severe cervical stenosis and myelopathy also with persistent bilateral hand numbness pain and paresthesias.  Patient status post multilevel anterior cervical decompression and fusion.  She has also been found to have severe bilateral carpal tunnel syndrome.  She presents now for right-sided carpal tunnel release.  Past Medical History:  Diagnosis Date   Anxiety    Arthritis    degenerative disc disease and carpal tunnel bilateral hands   Carpal tunnel syndrome, bilateral    Cervical radiculopathy    Constipation    Depression    Diaphoresis    Gallstones    GERD (gastroesophageal reflux disease)    Hair loss disorder    Hypertension    Malaise and fatigue    Panic attacks    Seasonal allergies    Sleep apnea    SOB (shortness of breath)     Past Surgical History:  Procedure Laterality Date   ANTERIOR CERVICAL DECOMP/DISCECTOMY FUSION N/A 06/14/2021   Procedure: Anterior Cervical Decompression Discectomy Fusion Cervical four-five, Cervical five-six;  Surgeon: Tiffany Larsson, MD;  Location: [facility];  Service: Neurosurgery;  Laterality: N/A;   ANTERIOR CERVICAL DECOMP/DISCECTOMY FUSION N/A 03/30/2022   Procedure: ACDF - C3-C4;  Surgeon: Tiffany Larsson, MD;  Location: [facility];  Service: Neurosurgery;  Laterality: N/A;  3C   CESAREAN SECTION     CHOLECYSTECTOMY  07/29/2012   Procedure: LAPAROSCOPIC CHOLECYSTECTOMY WITH INTRAOPERATIVE CHOLANGIOGRAM;  Surgeon: Tiffany Hector, MD;  Location: [facility];  Service: General;  Laterality: N/A;   neck pain     WISDOM TOOTH EXTRACTION  2016    History reviewed. No pertinent family history. Social History:  reports that she has never smoked. She has never used smokeless tobacco. She reports that she does not drink alcohol and does not use drugs.  Allergies: No Known Allergies  Medications Prior to Admission  Medication Sig Dispense Refill    aluminum-magnesium hydroxide 200-200 MG/5ML suspension Take 10 mLs by mouth every 6 (six) hours as needed for indigestion.     amitriptyline (ELAVIL) 25 MG tablet Take 25-50 mg by mouth at bedtime.     Aspirin-Acetaminophen-Caffeine (GOODY HEADACHE PO) Take 1 packet by mouth daily as needed (pain).     busPIRone (BUSPAR) 10 MG tablet Take 10 mg by mouth 2 (two) times daily.     escitalopram (LEXAPRO) 20 MG tablet Take 20 mg by mouth in the morning.     fenofibrate (TRICOR) 145 MG tablet Take 145 mg by mouth daily.     fluticasone (FLOVENT HFA) 220 MCG/ACT inhaler Inhale 2 puffs into the lungs daily.     HYDROcodone-acetaminophen (NORCO) 10-325 MG tablet Take 1-2 tablets by mouth every 4 (four) hours as needed for severe pain ((score 7 to 10)). (Patient taking differently: Take 1 tablet by mouth 3 (three) times daily.) 40 tablet 0   hydrOXYzine (ATARAX) 25 MG tablet Take 25 mg by mouth 3 (three) times daily.     losartan (COZAAR) 50 MG tablet Take 75 mg by mouth in the morning.     Omega-3 Fatty Acids (FISH OIL) 1200 MG CPDR Take 1,200 mg by mouth 2 (two) times daily.     omeprazole (PRILOSEC) 40 MG capsule Take 40 mg by mouth daily before breakfast.     tiZANidine (ZANAFLEX) 2 MG tablet Take 1 tablet (2 mg total) by mouth 3 (three) times daily. 30 tablet 0   Vitamin  D, Ergocalciferol, (DRISDOL) 1.25 MG (50000 UNIT) CAPS capsule Take 100,000 Units by mouth every Sunday.      Results for orders placed or performed during the hospital encounter of 08/30/22 (from the past 48 hour(s))  Basic metabolic panel per protocol     Status: Abnormal   Collection Time: 08/30/22  5:54 AM  Result Value Ref Range   Sodium 139 135 - 145 mmol/L   Potassium 3.7 3.5 - 5.1 mmol/L   Chloride 109 98 - 111 mmol/L   CO2 23 22 - 32 mmol/L   Glucose, Bld 121 (H) 70 - 99 mg/dL    Comment: Glucose reference range applies only to samples taken after fasting for at least 8 hours.   BUN 14 6 - 20 mg/dL   Creatinine, Ser  0.78 0.44 - 1.00 mg/dL   Calcium 8.7 (L) 8.9 - 10.3 mg/dL   GFR, Estimated >60 >60 mL/min    Comment: (NOTE) Calculated using the CKD-EPI Creatinine Equation (2021)    Anion gap 7 5 - 15    Comment: Performed at [facility] [address], [place], [place] [address]  CBC per protocol     Status: Abnormal   Collection Time: 08/30/22  5:54 AM  Result Value Ref Range   WBC 6.2 4.0 - 10.5 K/uL   RBC 4.10 3.87 - 5.11 MIL/uL   Hemoglobin 12.2 12.0 - 15.0 g/dL   HCT 36.3 36.0 - 46.0 %   MCV 88.5 80.0 - 100.0 fL   MCH 29.8 26.0 - 34.0 pg   MCHC 33.6 30.0 - 36.0 g/dL   RDW 12.8 11.5 - 15.5 %   Platelets 458 (H) 150 - 400 K/uL   nRBC 0.0 0.0 - 0.2 %    Comment: Performed at [facility], [place] [address], [place], [place] [address]  Pregnancy, urine     Status: None   Collection Time: 08/30/22  6:28 AM  Result Value Ref Range   Preg Test, Ur NEGATIVE NEGATIVE    Comment:        THE SENSITIVITY OF THIS METHODOLOGY IS >20 mIU/mL. Performed at [facility], [place] [address], [place],  [address]    No results found.  Pertinent items noted in HPI and remainder of comprehensive ROS otherwise negative.  Blood pressure 139/88, pulse 90, temperature 98.1 F (36.7 C), temperature source Oral, resp. rate 18, height '5\' 4"'$  (1.626 m), weight 103 kg, last menstrual period 08/27/2022, SpO2 94 %.  Patient is awake and alert.  She is oriented and appropriate.  Speech is fluent.  Judgment insight are intact.  Cranial nerve function normal bilateral.  Motor examination reveals intact motor strength bilaterally sensory examination with decrease sensation bilaterally in both distal upper extremities prickly in her bilateral median nerve distributions.  Patient with Tinel's at both wrist.  She has positive Phalen signs.  She has some mild thenar wasting.  Examination head ears eyes nose and throat is unremarked.  Chest and abdomen are benign.  Extremities are free from injury or  deformity. Assessment/Plan Bilateral carpal tunnel syndrome.  Plan right carpal tunnel release.  Risks and benefits of been explained.  Patient wishes to proceed.  Tiffany Ho 08/30/2022, 7:44 AM

## 2022-08-30 NOTE — Anesthesia Preprocedure Evaluation (Signed)
Anesthesia Evaluation  Patient identified by MRN, date of birth, ID band Patient awake    Reviewed: Allergy & Precautions, H&P , NPO status , Patient's Chart, lab work & pertinent test results  Airway Mallampati: II  TM Distance: >3 FB Neck ROM: Full    Dental no notable dental hx.    Pulmonary neg pulmonary ROS   Pulmonary exam normal breath sounds clear to auscultation       Cardiovascular hypertension, Pt. on medications Normal cardiovascular exam Rhythm:Regular Rate:Normal     Neuro/Psych negative neurological ROS  negative psych ROS   GI/Hepatic Neg liver ROS,GERD  ,,  Endo/Other  negative endocrine ROS    Renal/GU negative Renal ROS  negative genitourinary   Musculoskeletal negative musculoskeletal ROS (+)    Abdominal   Peds negative pediatric ROS (+)  Hematology negative hematology ROS (+)   Anesthesia Other Findings   Reproductive/Obstetrics negative OB ROS                             Anesthesia Physical Anesthesia Plan  ASA: 2  Anesthesia Plan: MAC   Post-op Pain Management:    Induction: Intravenous  PONV Risk Score and Plan: 2 and Propofol infusion and Treatment may vary due to age or medical condition  Airway Management Planned: Simple Face Mask  Additional Equipment:   Intra-op Plan:   Post-operative Plan:   Informed Consent: I have reviewed the patients History and Physical, chart, labs and discussed the procedure including the risks, benefits and alternatives for the proposed anesthesia with the patient or authorized representative who has indicated his/her understanding and acceptance.     Dental advisory given  Plan Discussed with: CRNA and Surgeon  Anesthesia Plan Comments:        Anesthesia Quick Evaluation

## 2022-08-30 NOTE — Brief Op Note (Signed)
08/30/2022  9:13 AM  PATIENT:  Tiffany Ho  41 y.o. female  PRE-OPERATIVE DIAGNOSIS:  Carpal tunnel syndrome  POST-OPERATIVE DIAGNOSIS:  Carpal tunnel syndrome  PROCEDURE:  Procedure(s): Carpal Tunnel Release - right (Right)  SURGEON:  Surgeon(s) and Role:    Earnie Larsson, MD - Primary  PHYSICIAN ASSISTANT:   ASSISTANTS:     ANESTHESIA:   general  EBL:  2 mL   BLOOD ADMINISTERED:none  DRAINS: none   LOCAL MEDICATIONS USED:  LIDOCAINE   SPECIMEN:  No Specimen  DISPOSITION OF SPECIMEN:  N/A  COUNTS:  YES  TOURNIQUET:  * No tourniquets in log *  DICTATION: .Dragon Dictation  PLAN OF CARE: Discharge to home after PACU  PATIENT DISPOSITION:  PACU - hemodynamically stable.   Delay start of Pharmacological VTE agent (>24hrs) due to surgical blood loss or risk of bleeding: yes

## 2022-08-30 NOTE — Anesthesia Postprocedure Evaluation (Signed)
Anesthesia Post Note  Patient: Tiffany Ho  Procedure(s) Performed: Carpal Tunnel Release - right (Right)     Patient location during evaluation: PACU Anesthesia Type: MAC Level of consciousness: awake and alert Pain management: pain level controlled Vital Signs Assessment: post-procedure vital signs reviewed and stable Respiratory status: spontaneous breathing, nonlabored ventilation, respiratory function stable and patient connected to nasal cannula oxygen Cardiovascular status: stable and blood pressure returned to baseline Postop Assessment: no apparent nausea or vomiting Anesthetic complications: no  No notable events documented.  Last Vitals:  Vitals:   08/30/22 0556 08/30/22 0855  BP: 139/88   Pulse: 90   Resp: 18   Temp: 36.7 C (!) 36.3 C  SpO2: 94%     Last Pain:  Vitals:   08/30/22 0855  TempSrc:   PainSc: 0-No pain                 Charleene Callegari S

## 2022-08-30 NOTE — Op Note (Signed)
Date of procedure: 08/30/2022  Date of dictation: Same  Service: Neurosurgery  Preoperative diagnosis: Right carpal tunnel syndrome  Postoperative diagnosis: Same  Procedure Name: Right carpal tunnel release  Surgeon:Terez Montee A.Ariyah Sedlack, M.D.  Asst. Surgeon: None  Anesthesia: General  Indication: 41 year old female with history of cervical myelopathy who is status post decompression and fusion surgery without complications.  She has persistent numbness and paresthesias in both hands and has severe carpal tunnel syndrome bilaterally confirmed by EMGs and nerve conduction studies.  Patient presents now for right-sided carpal tunnel release.  Operative note: After induction of anesthesia, patient position supine with the right upper extremity outstretched..  Patient's right upper extremities prepped and draped sterilely.  Incision was made along the mid palmar crease just distal to the distal wrist fold.  Local lidocaine was infiltrated prior to incision.  Incision was carried down sharply through the palmar fascia.  Transverse carpal ligament was identified.  Transverse carpal ligament was then divided sharply.  Underlying median nerve was identified and protected using a freer elevator.  The transverse carpal ligament was then divided distally into the palm and then proximally into the proximal forearm.  All limits of any compression were relieved.  No evidence of injury to any nerve or neural branches.  Wound was then irrigated.  Then closed in a typical fashion.  Sterile dressing was applied.  Patient tolerated procedure well and she returns to the recovery room postop.

## 2022-08-30 NOTE — Anesthesia Procedure Notes (Signed)
Procedure Name: MAC Date/Time: 08/30/2022 8:01 AM  Performed by: Carolan Clines, CRNAPre-anesthesia Checklist: Patient identified, Emergency Drugs available, Suction available and Patient being monitored Patient Re-evaluated:Patient Re-evaluated prior to induction Oxygen Delivery Method: Simple face mask Dental Injury: Teeth and Oropharynx as per pre-operative assessment

## 2022-08-31 ENCOUNTER — Encounter (HOSPITAL_COMMUNITY): Payer: Self-pay | Admitting: Neurosurgery

## 2022-09-20 ENCOUNTER — Ambulatory Visit (INDEPENDENT_AMBULATORY_CARE_PROVIDER_SITE_OTHER): Payer: Medicaid Other

## 2022-09-20 ENCOUNTER — Ambulatory Visit: Payer: Medicaid Other | Admitting: Pulmonary Disease

## 2022-09-20 ENCOUNTER — Encounter: Payer: Self-pay | Admitting: Pulmonary Disease

## 2022-09-20 VITALS — BP 128/70 | HR 51 | Ht 64.0 in | Wt 230.2 lb

## 2022-09-20 DIAGNOSIS — R0609 Other forms of dyspnea: Secondary | ICD-10-CM

## 2022-09-20 MED ORDER — BUDESONIDE-FORMOTEROL FUMARATE 160-4.5 MCG/ACT IN AERO: 2.0000 | INHALATION_SPRAY | Freq: Two times a day (BID) | RESPIRATORY_TRACT | 12 refills | Status: AC

## 2022-09-20 NOTE — Patient Instructions (Signed)
Nice to meet you  To help with your symptoms I recommend using Symbicort 2 puffs twice a day every day.  Rinse your mouth out after every use.  If this helps this is a strong indicator that asthma can be a problem.  We will get a chest x-ray today to further evaluate your symptoms  I ordered a CT scan of the chest to evaluate for possible blood clots in the lungs.  I think it is important to make sure this is not the issue especially since things started after surgery.  Return to clinic in 2 months or sooner as needed with Dr. Silas Flood

## 2022-09-20 NOTE — Progress Notes (Signed)
$'@Patient'x$  ID: Tiffany Ho, female    DOB: July 30, 1981, 41 y.o.   MRN: [id number]  Chief Complaint  Patient presents with   Consult    Consult fro SOB. Pt states that the SOB started this past year. She states it started after she had surgery on her neck. Pt is aware she has gained weight but she has never has these issues before. Pt is on flovent inhaler. She states that is does not seem to work.     Referring provider: Heywood Bene, *  HPI:   41 y.o. woman whom we are seeing in evaluation of dyspnea on exertion.  Operative note from neurosurgery x3 reviewed.  Most recent cardiology note reviewed.  Patient was in use of health.  Underwent cervical spine surgery.  06/2021.  Developed shortness of breath afterwards.  Second cervical spine surgery 03/2022.  Worsening shortness of breath.  To the point where developed shortness of breath at rest.  In addition to dyspnea on exertion that predated this.  Worse with inclines or stairs.  Severity comes and goes.  She thinks anxiety or stress has a lot to do with it.  Maybe slightly better although not a lot after using her starting medication for anxiety.  Gradually getting worse.  No time of day when things are better or worse.  No position make things better or worse.  No seasonal or environmental factors she can identify the big things better or worse.  No other alleviating or exacerbating factors.  She also has issues with swallowing, dysphagia.  Esophageal dysphagia most recent SLP eval 07/2022 on my review.  No chest imaging in the interim.  PMH: Anxiety, eosinophilic esophagitis? Surgical history: Cervical spine surgery on the right x2 Family history:History reviewed. No pertinent family history. Social history: Never smoker, lives in [place] / Pulmonary Flowsheets:   ACT:      No data to display          MMRC:     No data to display          Epworth:      No data to display           Tests:   FENO:  No results found for: "NITRICOXIDE"  PFT:     No data to display          WALK:      No data to display          Imaging: No results found.  Lab Results: Personally reviewed, notably eosinophils elevated to 700 06/2021 CBC    Component Value Date/Time   WBC 6.2 08/30/2022 0554   RBC 4.10 08/30/2022 0554   HGB 12.2 08/30/2022 0554   HCT 36.3 08/30/2022 0554   PLT 458 (H) 08/30/2022 0554   MCV 88.5 08/30/2022 0554   MCH 29.8 08/30/2022 0554   MCHC 33.6 08/30/2022 0554   RDW 12.8 08/30/2022 0554   RDW 13.1 05/21/2014 1616   LYMPHSABS 2.4 06/13/2021 0900   LYMPHSABS 3.0 05/21/2014 1616   MONOABS 0.7 06/13/2021 0900   EOSABS 0.7 (H) 06/13/2021 0900   EOSABS 0.3 05/21/2014 1616   BASOSABS 0.1 06/13/2021 0900   BASOSABS 0.0 05/21/2014 1616    BMET    Component Value Date/Time   NA 139 08/30/2022 0554   NA 139 05/21/2014 1616   K 3.7 08/30/2022 0554   CL 109 08/30/2022 0554   CO2 23 08/30/2022 0554   GLUCOSE 121 (H) 08/30/2022 0554  BUN 14 08/30/2022 0554   BUN 10 05/21/2014 1616   CREATININE 0.78 08/30/2022 0554   CALCIUM 8.7 (L) 08/30/2022 0554   GFRNONAA >60 08/30/2022 0554   GFRAA >60 08/13/2019 1300    BNP No results found for: "BNP"  ProBNP No results found for: "PROBNP"  Specialty Problems   None   No Known Allergies  Immunization History  Administered Date(s) Administered   Td (Adult),5 Lf Tetanus Toxid, Preservative Free 06/07/2022    Past Medical History:  Diagnosis Date   Anxiety    Arthritis    degenerative disc disease and carpal tunnel bilateral hands   Carpal tunnel syndrome, bilateral    Cervical radiculopathy    Constipation    Depression    Diaphoresis    Gallstones    GERD (gastroesophageal reflux disease)    Hair loss disorder    Hypertension    Malaise and fatigue    Panic attacks    Seasonal allergies    Sleep apnea    SOB (shortness of breath)     Tobacco History: Social  History   Tobacco Use  Smoking Status Never  Smokeless Tobacco Never   Counseling given: Not Answered   Continue to not smoke  Outpatient Encounter Medications as of 09/20/2022  Medication Sig   aluminum-magnesium hydroxide 200-200 MG/5ML suspension Take 10 mLs by mouth every 6 (six) hours as needed for indigestion.   amitriptyline (ELAVIL) 25 MG tablet Take 25-50 mg by mouth at bedtime.   Aspirin-Acetaminophen-Caffeine (GOODY HEADACHE PO) Take 1 packet by mouth daily as needed (pain).   budesonide-formoterol (SYMBICORT) 160-4.5 MCG/ACT inhaler Inhale 2 puffs into the lungs 2 (two) times daily.   busPIRone (BUSPAR) 10 MG tablet Take 10 mg by mouth 2 (two) times daily.   escitalopram (LEXAPRO) 20 MG tablet Take 20 mg by mouth in the morning.   fenofibrate (TRICOR) 145 MG tablet Take 145 mg by mouth daily.   fluticasone (FLOVENT HFA) 220 MCG/ACT inhaler Inhale 2 puffs into the lungs daily.   hydrOXYzine (ATARAX) 25 MG tablet Take 25 mg by mouth 3 (three) times daily.   losartan (COZAAR) 50 MG tablet Take 75 mg by mouth in the morning.   Omega-3 Fatty Acids (FISH OIL) 1200 MG CPDR Take 1,200 mg by mouth 2 (two) times daily.   omeprazole (PRILOSEC) 40 MG capsule Take 40 mg by mouth daily before breakfast.   tiZANidine (ZANAFLEX) 2 MG tablet Take 1 tablet (2 mg total) by mouth 3 (three) times daily.   Vitamin D, Ergocalciferol, (DRISDOL) 1.25 MG (50000 UNIT) CAPS capsule Take 100,000 Units by mouth every Sunday.   [DISCONTINUED] HYDROcodone-acetaminophen (NORCO) 10-325 MG tablet Take 1-2 tablets by mouth every 4 (four) hours as needed for severe pain ((score 7 to 10)). (Patient taking differently: Take 1 tablet by mouth 3 (three) times daily.)   No facility-administered encounter medications on file as of 09/20/2022.     Review of Systems  Review of Systems   Physical Exam  BP 128/70 (BP Location: Left Arm, Patient Position: Sitting, Cuff Size: Normal)   Pulse (!) 51   Ht '5\' 4"'$   (1.626 m)   Wt 230 lb 3.2 oz (104.4 kg)   LMP 08/27/2022   SpO2 95%   BMI 39.51 kg/m   Wt Readings from Last 5 Encounters:  09/20/22 230 lb 3.2 oz (104.4 kg)  08/30/22 227 lb (103 kg)  07/25/22 224 lb (101.6 kg)  03/30/22 220 lb (99.8 kg)  03/23/22 219 lb 3.2  oz (99.4 kg)    BMI Readings from Last 5 Encounters:  09/20/22 39.51 kg/m  08/30/22 38.96 kg/m  07/25/22 38.45 kg/m  03/30/22 37.76 kg/m  03/23/22 37.63 kg/m     Physical Exam General: Sitting in chair, mild respiratory distress Eyes: EOMI, no icterus Neck: Supple, JVP Pulmonary: Clear, equal and symmetric in bilateral bases, normal work of breathing with occasional increase in sigh breaths, intermittent tachypnea at rest that self resolves Cardiovascular: Warm, no edema Abdomen: Nondistended, bowel sounds present MSK: No synovitis, no joint effusion Neuro: Normal gait, no weakness Psych: Normal mood, normal affect  Assessment & Plan:   Shortness of breath/dyspnea on exertion: Started after cervical spine surgery fall 2022 and second cervical spine surgery 04/2022.  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  However her lung sounds are clear bilaterally without evidence of diminishment in the right base.  No chest imaging in the interim, chest x-ray today.  Says started after surgery, concern for development of pulmonary embolus now would be chronic based on duration of symptoms.  CTA PE protocol ordered for further evaluation.  If these tests are normal plan to move forward with pulmonary function test.  Lastly, trial ICS/LABA therapy via high-dose Symbicort for possible asthma given elevated eosinophils on serial blood counts.  She has gained weight but could be contributing.  Finally, discussed her underlying anxiety and stress contributing to the sensation of her symptoms.  Stressed the importance of adequate and aggressive treatment of her anxiety.  She does reveal  increased sigh breaths suspicious for hyperventilation syndrome.   Return in about 2 months (around 11/20/2022).   Lanier Clam, MD 09/20/2022   This appointment required 62 minutes of patient care (this includes precharting, chart review, review of results, face-to-face care, etc.).

## 2022-09-21 ENCOUNTER — Encounter: Payer: Self-pay | Admitting: Pulmonary Disease

## 2022-09-25 NOTE — Progress Notes (Signed)
CXR is clear

## 2022-09-27 ENCOUNTER — Other Ambulatory Visit: Payer: Medicaid Other

## 2022-10-09 ENCOUNTER — Other Ambulatory Visit: Payer: Self-pay | Admitting: Neurosurgery

## 2022-10-16 NOTE — Progress Notes (Signed)
Anesthesia Chart Review: [facility]  Case: [id number] Date/Time: 10/17/22 0745   Procedure: LEFT CARPAL TUNNEL RELEASE (Left)   Anesthesia type: Monitor Anesthesia Care   Pre-op diagnosis: Carpal tunnel syndrome   [facility]: [facility]   Surgeons: Tiffany Larsson, MD       DISCUSSION: Patient is a 41 year old female scheduled for the above procedure. S/p right carpal tunnel release on 08/30/22.   History includes never smoker, anxiety with panic attacks, GERD, dyspnea, OSA (mild 2023), cholecystectomy, spinal surgery (C4-6 ACDF 06/14/21; C3-4 ACDF 03/30/22).  She had cardiology evaluation at [facility] initially on 01/17/22 with Dr. Ali Ho. She had been referred for dyspnea. She had reported a 25 lb weight gain, so he felt this was likely the primary contributor. Echo ordered as well a a sleep study. 01/31/22 echo showed normal biventricular function without significant valvular disease. 04/11/22 HST showed mild OSA with AHI 7.4/hr. Because of symptoms of excessive daytime sleepiness and trial of autoCPAP from 4-15 cm H20 recommended. OSA is followed by Dr. Radford Ho.   Pulmonology evaluation on 09/20/22 by Dr. Silas Ho for dyspnea which she thought started following her neck surgery in August 2022 and felt it was worse after her second neck surgery in May 2023. She has had unremarkable echo and was diagnosed with mild OSA. She had speech therapy evaluation with modified barium swallow study on 07/18/22 for evaluation of suspected esophageal dysphagia. She reported feeling like some foods were getting stuck in her throat. Imaging after a breakfast bar showed retention of material in esophagus followed by complaints of chest discomfort and immediate regurgitation. Notes suggest this caused a bit of anxiety for her, so ST reminded her that those specific symptoms correlating with food retention was occurring in her esophagus and not her airway Ho heart. She was referred to GI. CXR ordered by Dr.  Silas Ho and was clear without elevated hemidiaphragm. He also ordered a CTA PE protocol to look for chronic PE, and if negative would plan for PFTs. She has not had a CTA Chest. He also planned trial of Budesonide-Formoterol for possible asthma given elevated eosinophils on serial blood counts. He thought weight gain and hyperventilation syndrome could also be contributing, and he felt her anxiety and stress could also be contributing to the sensation of her symptoms. He recommended aggressive treatment for her anxiety.   Staff are awaiting a call back from patient to reassess and provide preoperative education. In the interim, I reviewed available information with anesthesiologist Tiffany Mouse, MD. Patient has had symptoms of dyspnea for > 1 year with work-up as above--she underwent two surgeries since including similar surgery in October. It does not appear that she ever had a chest CTA Ho PFTs. If not acute changes in her respiratory status, then anesthesia team will evaluate on the day of surgery.   VS:  Wt Readings from Last 3 Encounters:  09/20/22 104.4 kg  08/30/22 103 kg  07/25/22 101.6 kg   BP Readings from Last 3 Encounters:  09/20/22 128/70  08/30/22 125/79  07/25/22 (!) 145/95   Pulse Readings from Last 3 Encounters:  09/20/22 (!) 51  08/30/22 76  03/30/22 (!) 109     PROVIDERS: [facility], [facility], MD is cardiologist (OSA) Tiffany Sciara, MD is cardiologist. As needed follow-up 01/17/22 unless dyspnea fails to improve Ho worsen. He did refer her to Dr. Radford Ho for sleep evaluation.  Tiffany Days, MD is pulmonologist Tiffany Ada, MD  is GI   LABS: Most recent lab results include a CMP, CBC, A1c on 09/26/22 through [facility]. Results include: Normal CBC and CMP other than glucose 113. A1c was 6.0%.    Home Sleep Study 04/11/22: FINDINGS: 1. Mild Obstructive Sleep Apnea with AHI /7.4hr. 2. No Central Sleep Apnea with  pAHIc 0.1/hr. 3. Oxygen desaturations as low as 79%. 4. Mild snoring was present. O2 sats were < 88% for min. 5. Total sleep time was 7 hrs and 18 min. 6. 24.9% of total sleep time was spent in REM sleep. 7. Normal sleep onset latency at 17 min. 8. Normal REM sleep onset latency at 71 min. 9. Total awakenings were 4. RECOMMENDATIONS: 1. Clinical correlation of these findings is necessary. The decision to treat obstructive sleep apnea (OSA) is usually based on the presence of apnea symptoms Ho the presence of associated medical conditions...   IMAGES: Xray C-spine 10/12/22 ([facility]): CONCLUSION: 1. Interval extension of the ACDF now spanning C3-C6, previously C4-C6. Mild periscrew lucency about the L3 and L6 screws can be seen in hardware loosening. 2. Dynamic widening of the interspinous intervals at C3-C4 and C5-C6 suggestive incomplete osseous fusion at these levels. No substantial motion at the C4-C5 level. 3. Hardware is intact with similar degree of subsidence of the C4-C5 interbody spacer. 4. Mild anterolisthesis of C2 on C3 without significant change on flexion Ho extension. 5. Mild multilevel cervical spondylosis at the unfused levels of cervical spine.   CXR 09/20/22:  FINDINGS: Trachea is midline. Heart size normal. Lungs are clear. No pleural fluid. IMPRESSION: Negative.  Modified Barium Swallow Study 07/18/22: Pt presents with a suspected primary esophageal dysphagia. Monitor was visible to pt so that we could discuss MBS in real time and use imaging for feedback. Oral and pharyngeal function were WNL. There was consistent laryngeal/vestibule closure and no penetration/aspiration. There was normal bolus clearance through pharynx.  Intermittent scans of the esophagus revealed barium status. Pt pointed to her throat throughout study, indicating where she felt material was lodging; she was able to visualize absence of material in throat and we talked about referred sensation  from the esophagus.  Consumption of a breakfast bar led confirmed [name]'s symptoms - there was retention of material in esophagus followed by her c/o discomfort and immediate regurgitation.  We discussed the esophagus as being the likely source of her condition. She was provided with a handout showing basic anatomy.  Recommendations included:  1) f/u with Dr. Benson Norway to further evaluate the esophagus;  2) eliminate tough/hard foods from diet and focus on soft/moist foods, smoothies, yogurt. Utilize her blender to liquify foods when able and pending GI f/u;  3) use what she has learned about her situation to help abate anxiety when possible - remind herself during an event that the food is in her esophagus, not her airway; that she is not having a cardiac event, and that the situation is temporary.   4) try neck/shoulder massage to help with the tension she is experiencing after regurgitation events and likely prior to events when she is having an anticipatory response.    EKG: 01/17/22: SR at 98 bpm   CV: Echo 01/31/22: IMPRESSIONS   1. Left ventricular ejection fraction, by estimation, is 60 to 65%. The  left ventricle has normal function. The left ventricle has no regional  wall motion abnormalities. Left ventricular diastolic parameters were  normal. The average left ventricular  global longitudinal strain is -25.0 %. The global longitudinal strain  is  normal.   2. Right ventricular systolic function is normal. The right ventricular  size is normal. Tricuspid regurgitation signal is inadequate for assessing  PA pressure.   3. The mitral valve is normal in structure. No evidence of mitral valve  regurgitation.   4. The aortic valve is normal in structure. Aortic valve regurgitation is  not visualized.   5. The inferior vena cava is normal in size with greater than 50%  respiratory variability, suggesting right atrial pressure of 3 mmHg.  - Comparison(s): No prior Echocardiogram.  -  Conclusion(s)/Recommendation(s): Normal biventricular function without  evidence of hemodynamically significant valvular heart disease.    Past Medical History:  Diagnosis Date   Anxiety    Arthritis    degenerative disc disease and carpal tunnel bilateral hands   Carpal tunnel syndrome, bilateral    Cervical radiculopathy    Constipation    Depression    Diaphoresis    Gallstones    GERD (gastroesophageal reflux disease)    Hair loss disorder    Hypertension    Malaise and fatigue    Panic attacks    Seasonal allergies    Sleep apnea    SOB (shortness of breath)     Past Surgical History:  Procedure Laterality Date   ANTERIOR CERVICAL DECOMP/DISCECTOMY FUSION N/A 06/14/2021   Procedure: Anterior Cervical Decompression Discectomy Fusion Cervical four-five, Cervical five-six;  Surgeon: [facility] Larsson, MD;  Location: [place];  Service: Neurosurgery;  Laterality: N/A;   ANTERIOR CERVICAL DECOMP/DISCECTOMY FUSION N/A 03/30/2022   Procedure: ACDF - C3-C4;  Surgeon: [facility]iffany Larsson, MD;  Location: [place];  Service: Neurosurgery;  Laterality: N/A;  3C   CARPAL TUNNEL RELEASE Right 08/30/2022   Procedure: Carpal Tunnel Release - right;  Surgeon: [facility] Larsson, MD;  Location: [place];  Service: Neurosurgery;  Laterality: Right;   CESAREAN SECTION     CHOLECYSTECTOMY  07/29/2012   Procedure: LAPAROSCOPIC CHOLECYSTECTOMY WITH INTRAOPERATIVE CHOLANGIOGRAM;  Surgeon: Adin Hector, MD;  Location: [place];  Service: General;  Laterality: N/A;   neck pain     WISDOM TOOTH EXTRACTION  2016    MEDICATIONS: No current facility-administered medications for this encounter.    aluminum-magnesium hydroxide 200-200 MG/5ML suspension   amitriptyline (ELAVIL) 25 MG tablet   Aspirin-Acetaminophen-Caffeine (GOODY HEADACHE PO)   budesonide-formoterol (SYMBICORT) 160-4.5 MCG/ACT inhaler   busPIRone (BUSPAR) 10 MG tablet   escitalopram (LEXAPRO) 20 MG tablet   fenofibrate (TRICOR) 145 MG tablet   fluticasone  (FLOVENT HFA) 220 MCG/ACT inhaler   hydrOXYzine (ATARAX) 25 MG tablet   losartan (COZAAR) 50 MG tablet   Omega-3 Fatty Acids (FISH OIL) 1200 MG CPDR   omeprazole (PRILOSEC) 40 MG capsule   tiZANidine (ZANAFLEX) 2 MG tablet   Vitamin D, Ergocalciferol, (DRISDOL) 1.25 MG (50000 UNIT) CAPS capsule    Myra Gianotti, PA-C Surgical Short Stay/Anesthesiology [facility] Phone [phone] [facility] Phone [phone] 10/16/2022 1:31 PM

## 2022-10-16 NOTE — Progress Notes (Signed)
Patient called back short stay with questions regarding her surgery scheduled for tomorrow morning. Patient was instructed to be at the hospital at 05:30 o'clock and go to the admitting office; Patient was instructed to not eat or drink anything after midnight, but she can have with a small sip of water: Lexapro, Buspar, Flovent, Hydroxyzine, Tizanidine, fenofibrate, omeprazole; Symbicort Patient was instructed to take a shower tonight and tomorrow morning with Dial soap. Patient verbalized understanding. Patient denied any acute distress; patient denied any sings/symptoms of COVID or being in contact with somebody tested positive for COVID or with symptoms of COVID.

## 2022-10-16 NOTE — Progress Notes (Signed)
Unable to speak directly to patient, left message with instructions and return number [phone]  PCP -  Cardiologist -  EKG - 01/17/22 Chest x-ray - 11/23 ECHO - 3/23 Cardiac Cath -  CPAP -  Fasting Blood Sugar:   Checks Blood Sugar:  /day Blood Thinner Instructions:  Aspirin Instructions:  ERAS Protcol - NPO COVID TEST-   Anesthesia review: Ebony Hail, [place] Aware of patient  -------------  SDW INSTRUCTIONS:  Your procedure is scheduled on 10/17/22. Please report to [facility] Main Entrance "A" at 530 A.M., and check in at the Admitting office. Call this number if you have problems the morning of surgery: [phone]   Remember: Do not eat or drink after midnight the night before your surgery    Medications to take morning of surgery with a sip of water include: Symbicort, Lexapro, Buspar, Flovent, Hydroxyzine, Tizanidine, fenofibrate, omeprazole    The Morning of Surgery Do not wear jewelry, make-up or nail polish. Do not wear lotions, powders, or perfumes, or deodorant Do not bring valuables to the hospital. [facility] is not responsible for any belongings or valuables.   Remember that you must have someone to transport you home after your surgery, and remain with you for 24 hours if you are discharged the same day.  Patients discharged the day of surgery will not be allowed to drive home.   Please shower the NIGHT BEFORE/MORNING OF SURGERY (use antibacterial soap like DIAL soap if possible). Wear comfortable clothes the morning of surgery. Oral Hygiene is also important to reduce your risk of infection.  Remember - BRUSH YOUR TEETH THE MORNING OF SURGERY WITH YOUR REGULAR TOOTHPASTE

## 2022-10-16 NOTE — Anesthesia Preprocedure Evaluation (Addendum)
Anesthesia Evaluation  Patient identified by MRN, date of birth, ID band Patient awake    Reviewed: Allergy & Precautions, NPO status , Patient's Chart, lab work & pertinent test results  History of Anesthesia Complications Negative for: history of anesthetic complications  Airway Mallampati: III  TM Distance: >3 FB Neck ROM: Full    Dental no notable dental hx.    Pulmonary neg shortness of breath, sleep apnea , neg COPD, neg recent URI   Pulmonary exam normal breath sounds clear to auscultation       Cardiovascular hypertension (losartan), Pt. on medications (-) angina (-) Past MI, (-) Cardiac Stents and (-) CABG  Rhythm:Regular Rate:Normal  HLD  TTE 01/31/2022: IMPRESSIONS     1. Left ventricular ejection fraction, by estimation, is 60 to 65%. The  left ventricle has normal function. The left ventricle has no regional  wall motion abnormalities. Left ventricular diastolic parameters were  normal. The average left ventricular  global longitudinal strain is -25.0 %. The global longitudinal strain is  normal.   2. Right ventricular systolic function is normal. The right ventricular  size is normal. Tricuspid regurgitation signal is inadequate for assessing  PA pressure.   3. The mitral valve is normal in structure. No evidence of mitral valve  regurgitation.   4. The aortic valve is normal in structure. Aortic valve regurgitation is  not visualized.   5. The inferior vena cava is normal in size with greater than 50%  respiratory variability, suggesting right atrial pressure of 3 mmHg.     Neuro/Psych  PSYCHIATRIC DISORDERS (panic attacks) Anxiety Depression     Neuromuscular disease (cervical radiculopathy s/p ACDF)    GI/Hepatic Neg liver ROS,GERD  Medicated,,  Endo/Other  negative endocrine ROS    Renal/GU negative Renal ROS     Musculoskeletal  (+) Arthritis ,    Abdominal  (+) + obese  Peds   Hematology negative hematology ROS (+)   Anesthesia Other Findings   Reproductive/Obstetrics                              Anesthesia Physical Anesthesia Plan  ASA: 3  Anesthesia Plan: MAC   Post-op Pain Management: Minimal or no pain anticipated   Induction: Intravenous  PONV Risk Score and Plan: 2 and Propofol infusion, Ondansetron and Midazolam  Airway Management Planned: Natural Airway and Simple Face Mask  Additional Equipment:   Intra-op Plan:   Post-operative Plan:   Informed Consent: I have reviewed the patients History and Physical, chart, labs and discussed the procedure including the risks, benefits and alternatives for the proposed anesthesia with the patient or authorized representative who has indicated his/her understanding and acceptance.     Dental advisory given  Plan Discussed with: CRNA and Anesthesiologist  Anesthesia Plan Comments: (PAT note written 10/16/2022 by Shonna Chock, PA-C.  Discussed with patient risks of MAC including, but not limited to, minor pain or discomfort, hearing people in the room, and possible need for backup general anesthesia. Risks for general anesthesia also discussed including, but not limited to, sore throat, hoarse voice, chipped/damaged teeth, injury to vocal cords, nausea and vomiting, allergic reactions, lung infection, heart attack, stroke, and death. All questions answered.   )        Anesthesia Quick Evaluation

## 2022-10-17 ENCOUNTER — Encounter (HOSPITAL_COMMUNITY)
Admission: RE | Disposition: A | Discharge status: Home / Self Care | Payer: Self-pay | Source: Home / Self Care | Attending: Neurosurgery

## 2022-10-17 ENCOUNTER — Ambulatory Visit (HOSPITAL_COMMUNITY)
Admission: RE | Admit: 2022-10-17 | Discharge: 2022-10-17 | Disposition: A | Discharge status: Home / Self Care | Payer: Medicaid Other | Attending: Neurosurgery | Admitting: Neurosurgery

## 2022-10-17 ENCOUNTER — Other Ambulatory Visit: Payer: Self-pay

## 2022-10-17 ENCOUNTER — Ambulatory Visit (HOSPITAL_COMMUNITY): Payer: Medicaid Other | Admitting: Vascular Surgery

## 2022-10-17 ENCOUNTER — Ambulatory Visit (HOSPITAL_BASED_OUTPATIENT_CLINIC_OR_DEPARTMENT_OTHER): Payer: Medicaid Other | Admitting: Vascular Surgery

## 2022-10-17 ENCOUNTER — Encounter (HOSPITAL_COMMUNITY): Payer: Self-pay | Admitting: Neurosurgery

## 2022-10-17 DIAGNOSIS — K219 Gastro-esophageal reflux disease without esophagitis: Secondary | ICD-10-CM | POA: Insufficient documentation

## 2022-10-17 DIAGNOSIS — Z79899 Other long term (current) drug therapy: Secondary | ICD-10-CM | POA: Insufficient documentation

## 2022-10-17 DIAGNOSIS — M1990 Unspecified osteoarthritis, unspecified site: Secondary | ICD-10-CM

## 2022-10-17 DIAGNOSIS — I10 Essential (primary) hypertension: Secondary | ICD-10-CM

## 2022-10-17 DIAGNOSIS — Z981 Arthrodesis status: Secondary | ICD-10-CM | POA: Insufficient documentation

## 2022-10-17 DIAGNOSIS — F410 Panic disorder [episodic paroxysmal anxiety] without agoraphobia: Secondary | ICD-10-CM | POA: Diagnosis not present

## 2022-10-17 DIAGNOSIS — G5601 Carpal tunnel syndrome, right upper limb: Secondary | ICD-10-CM

## 2022-10-17 DIAGNOSIS — G5602 Carpal tunnel syndrome, left upper limb: Secondary | ICD-10-CM | POA: Insufficient documentation

## 2022-10-17 DIAGNOSIS — Z01818 Encounter for other preprocedural examination: Secondary | ICD-10-CM

## 2022-10-17 DIAGNOSIS — G4730 Sleep apnea, unspecified: Secondary | ICD-10-CM

## 2022-10-17 DIAGNOSIS — F32A Depression, unspecified: Secondary | ICD-10-CM | POA: Insufficient documentation

## 2022-10-17 HISTORY — PX: CARPAL TUNNEL RELEASE: SHX101

## 2022-10-17 LAB — POCT PREGNANCY, URINE: Preg Test, Ur: NEGATIVE

## 2022-10-17 SURGERY — CARPAL TUNNEL RELEASE
Anesthesia: Monitor Anesthesia Care | Site: Hand | Laterality: Left

## 2022-10-17 MED ORDER — CHLORHEXIDINE GLUCONATE CLOTH 2 % EX PADS: 6.0000 | MEDICATED_PAD | Freq: Once | CUTANEOUS | Status: DC

## 2022-10-17 MED ORDER — 0.9 % SODIUM CHLORIDE (POUR BTL) OPTIME
TOPICAL | Status: DC | PRN
  Administered 2022-10-17: 1000 mL

## 2022-10-17 MED ORDER — DEXAMETHASONE SODIUM PHOSPHATE 10 MG/ML IJ SOLN
INTRAMUSCULAR | Status: DC | PRN
  Administered 2022-10-17: 10 mg via INTRAVENOUS

## 2022-10-17 MED ORDER — FENTANYL CITRATE (PF) 100 MCG/2ML IJ SOLN
INTRAMUSCULAR | Status: AC
  Filled 2022-10-17: qty 2

## 2022-10-17 MED ORDER — ONDANSETRON HCL 4 MG/2ML IJ SOLN
INTRAMUSCULAR | Status: DC | PRN
  Administered 2022-10-17: 4 mg via INTRAVENOUS

## 2022-10-17 MED ORDER — PROMETHAZINE HCL 25 MG/ML IJ SOLN: 6.2500 mg | INTRAMUSCULAR | Status: DC | PRN

## 2022-10-17 MED ORDER — CHLORHEXIDINE GLUCONATE 0.12 % MT SOLN
OROMUCOSAL | Status: AC
  Administered 2022-10-17: 15 mL via OROMUCOSAL
  Filled 2022-10-17: qty 15

## 2022-10-17 MED ORDER — LIDOCAINE 2% (20 MG/ML) 5 ML SYRINGE
INTRAMUSCULAR | Status: DC | PRN
  Administered 2022-10-17: 60 mg via INTRAVENOUS

## 2022-10-17 MED ORDER — HYDROCODONE-ACETAMINOPHEN 5-325 MG PO TABS: 1.0000 | ORAL_TABLET | Freq: Three times a day (TID) | ORAL | 0 refills | Status: AC

## 2022-10-17 MED ORDER — CHLORHEXIDINE GLUCONATE 0.12 % MT SOLN
15.0000 mL | OROMUCOSAL | Status: AC
  Filled 2022-10-17: qty 15

## 2022-10-17 MED ORDER — MIDAZOLAM HCL 2 MG/2ML IJ SOLN
INTRAMUSCULAR | Status: DC | PRN
  Administered 2022-10-17: 2 mg via INTRAVENOUS

## 2022-10-17 MED ORDER — CEFAZOLIN SODIUM-DEXTROSE 2-4 GM/100ML-% IV SOLN
2.0000 g | INTRAVENOUS | Status: AC
  Administered 2022-10-17: 2 g via INTRAVENOUS

## 2022-10-17 MED ORDER — GLYCOPYRROLATE 0.2 MG/ML IJ SOLN
INTRAMUSCULAR | Status: DC | PRN
  Administered 2022-10-17: .1 mg via INTRAVENOUS

## 2022-10-17 MED ORDER — OXYCODONE HCL 5 MG PO TABS: 5.0000 mg | ORAL_TABLET | Freq: Once | ORAL | Status: DC | PRN

## 2022-10-17 MED ORDER — LIDOCAINE-EPINEPHRINE 1 %-1:100000 IJ SOLN
INTRAMUSCULAR | Status: DC | PRN
  Administered 2022-10-17: 5 mL

## 2022-10-17 MED ORDER — LIDOCAINE-EPINEPHRINE 1 %-1:100000 IJ SOLN
INTRAMUSCULAR | Status: AC
  Filled 2022-10-17: qty 1

## 2022-10-17 MED ORDER — FENTANYL CITRATE (PF) 100 MCG/2ML IJ SOLN
INTRAMUSCULAR | Status: DC | PRN
  Administered 2022-10-17 (×2): 50 ug via INTRAVENOUS

## 2022-10-17 MED ORDER — CEFAZOLIN SODIUM-DEXTROSE 2-4 GM/100ML-% IV SOLN
INTRAVENOUS | Status: AC
  Filled 2022-10-17: qty 100

## 2022-10-17 MED ORDER — PROPOFOL 10 MG/ML IV BOLUS
INTRAVENOUS | Status: DC | PRN
  Administered 2022-10-17 (×2): 30 mg via INTRAVENOUS

## 2022-10-17 MED ORDER — OXYCODONE HCL 5 MG/5ML PO SOLN: 5.0000 mg | Freq: Once | ORAL | Status: DC | PRN

## 2022-10-17 MED ORDER — MIDAZOLAM HCL 2 MG/2ML IJ SOLN
INTRAMUSCULAR | Status: AC
  Filled 2022-10-17: qty 2

## 2022-10-17 MED ORDER — LACTATED RINGERS IV SOLN: INTRAVENOUS | Status: DC

## 2022-10-17 MED ORDER — FENTANYL CITRATE (PF) 100 MCG/2ML IJ SOLN: 25.0000 ug | INTRAMUSCULAR | Status: DC | PRN

## 2022-10-17 MED ORDER — PROPOFOL 500 MG/50ML IV EMUL
INTRAVENOUS | Status: DC | PRN
  Administered 2022-10-17: 100 ug/kg/min via INTRAVENOUS

## 2022-10-17 SURGICAL SUPPLY — 36 items
BAG COUNTER SPONGE SURGICOUNT (BAG) ×2 IMPLANT
BAG DECANTER FOR FLEXI CONT (MISCELLANEOUS) ×2 IMPLANT
BAG SPNG CNTER NS LX DISP (BAG) ×1
BLADE SURG 15 STRL LF DISP TIS (BLADE) ×2 IMPLANT
BLADE SURG 15 STRL SS (BLADE) ×1
BNDG ELASTIC 3X5.8 VLCR STR LF (GAUZE/BANDAGES/DRESSINGS) ×2 IMPLANT
CABLE BIPOLOR RESECTION CORD (MISCELLANEOUS) ×2 IMPLANT
CANISTER SUCT 3000ML PPV (MISCELLANEOUS) ×2 IMPLANT
DRAPE EXTREMITY T 121X128X90 (DISPOSABLE) ×2 IMPLANT
DRAPE HALF SHEET 40X57 (DRAPES) ×2 IMPLANT
GAUZE 4X4 16PLY ~~LOC~~+RFID DBL (SPONGE) ×2 IMPLANT
GAUZE SPONGE 4X4 12PLY STRL (GAUZE/BANDAGES/DRESSINGS) ×2 IMPLANT
GAUZE SPONGE 4X4 12PLY STRL LF (GAUZE/BANDAGES/DRESSINGS) IMPLANT
GLOVE ECLIPSE 9.0 STRL (GLOVE) ×2 IMPLANT
GLOVE EXAM NITRILE XL STR (GLOVE) IMPLANT
GOWN STRL REUS W/ TWL LRG LVL3 (GOWN DISPOSABLE) IMPLANT
GOWN STRL REUS W/ TWL XL LVL3 (GOWN DISPOSABLE) IMPLANT
GOWN STRL REUS W/TWL 2XL LVL3 (GOWN DISPOSABLE) IMPLANT
GOWN STRL REUS W/TWL LRG LVL3 (GOWN DISPOSABLE)
GOWN STRL REUS W/TWL XL LVL3 (GOWN DISPOSABLE)
KIT BASIN OR (CUSTOM PROCEDURE TRAY) ×2 IMPLANT
KIT TURNOVER KIT B (KITS) ×2 IMPLANT
NDL HYPO 25X1 1.5 SAFETY (NEEDLE) ×2 IMPLANT
NEEDLE HYPO 25X1 1.5 SAFETY (NEEDLE) ×1 IMPLANT
NS IRRIG 1000ML POUR BTL (IV SOLUTION) ×2 IMPLANT
PACK BASIC III (CUSTOM PROCEDURE TRAY) ×1
PACK SRG BSC III STRL LF ECLPS (CUSTOM PROCEDURE TRAY) ×2 IMPLANT
STOCKINETTE 4X48 STRL (DRAPES) ×2 IMPLANT
SUT ETHILON 4 0 PS 2 18 (SUTURE) ×2 IMPLANT
SUT VICRYL 4-0 PS2 18IN ABS (SUTURE) ×2 IMPLANT
SYR BULB EAR ULCER 3OZ GRN STR (SYRINGE) ×2 IMPLANT
SYR CONTROL 10ML LL (SYRINGE) ×2 IMPLANT
TOWEL GREEN STERILE (TOWEL DISPOSABLE) ×2 IMPLANT
TOWEL GREEN STERILE FF (TOWEL DISPOSABLE) ×2 IMPLANT
UNDERPAD 30X36 HEAVY ABSORB (UNDERPADS AND DIAPERS) ×2 IMPLANT
WATER STERILE IRR 1000ML POUR (IV SOLUTION) ×2 IMPLANT

## 2022-10-17 NOTE — Transfer of Care (Signed)
Immediate Anesthesia Transfer of Care Note  Patient: [facility]  Procedure(s) Performed: LEFT CARPAL TUNNEL RELEASE (Left: Hand)  Patient Location: PACU  Anesthesia Type:MAC  Level of Consciousness: awake, alert , and oriented  Airway & Oxygen Therapy: Patient Spontanous Breathing  Post-op Assessment: Report given to RN, Post -op Vital signs reviewed and stable, and Patient moving all extremities X 4  Post vital signs: Reviewed and stable  Last Vitals:  Vitals Value Taken Time  BP 120/92 10/17/22 0906  Temp 36.6 C 10/17/22 0905  Pulse 80 10/17/22 0910  Resp 20 10/17/22 0910  SpO2 93 % 10/17/22 0910  Vitals shown include unvalidated device data.  Last Pain:  Vitals:   10/17/22 0905  TempSrc:   PainSc: 0-No pain      Patients Stated Pain Goal: 0 (58/94/83 4758)  Complications: No notable events documented.

## 2022-10-17 NOTE — Anesthesia Postprocedure Evaluation (Signed)
Anesthesia Post Note  Patient: [facility]  Procedure(s) Performed: LEFT CARPAL TUNNEL RELEASE (Left: Hand)     Patient location during evaluation: PACU Anesthesia Type: MAC Level of consciousness: awake Pain management: pain level controlled Vital Signs Assessment: post-procedure vital signs reviewed and stable Respiratory status: spontaneous breathing, nonlabored ventilation and respiratory function stable Cardiovascular status: stable and blood pressure returned to baseline Postop Assessment: no apparent nausea or vomiting Anesthetic complications: no   No notable events documented.  Last Vitals:  Vitals:   10/17/22 0915 10/17/22 0930  BP: 136/82 115/79  Pulse: 94 81  Resp: 20 20  Temp:  36.6 C  SpO2: 99% 100%    Last Pain:  Vitals:   10/17/22 0930  TempSrc:   PainSc: 0-No pain                 Nilda Simmer

## 2022-10-17 NOTE — H&P (Signed)
[facility] is an 41 y.o. female.   Chief Complaint: Numbness HPI: 41 year old female with symptomatic left-sided carpal tunnel syndrome failing conservative management.  Patient presents now for a left-sided carpal tunnel release.  Past Medical History:  Diagnosis Date   Anxiety    Arthritis    degenerative disc disease and carpal tunnel bilateral hands   Carpal tunnel syndrome, bilateral    Cervical radiculopathy    Constipation    Depression    Diaphoresis    Gallstones    GERD (gastroesophageal reflux disease)    Hair loss disorder    Hypertension    Malaise and fatigue    Panic attacks    Seasonal allergies    Sleep apnea    SOB (shortness of breath)     Past Surgical History:  Procedure Laterality Date   ANTERIOR CERVICAL DECOMP/DISCECTOMY FUSION N/A 06/14/2021   Procedure: Anterior Cervical Decompression Discectomy Fusion Cervical four-five, Cervical five-six;  Surgeon: Earnie Larsson, MD;  Location: [place];  Service: Neurosurgery;  Laterality: N/A;   ANTERIOR CERVICAL DECOMP/DISCECTOMY FUSION N/A 03/30/2022   Procedure: ACDF - C3-C4;  Surgeon: Earnie Larsson, MD;  Location: [place];  Service: Neurosurgery;  Laterality: N/A;  3C   CARPAL TUNNEL RELEASE Right 08/30/2022   Procedure: Carpal Tunnel Release - right;  Surgeon: Earnie Larsson, MD;  Location: [place];  Service: Neurosurgery;  Laterality: Right;   CESAREAN SECTION     CHOLECYSTECTOMY  07/29/2012   Procedure: LAPAROSCOPIC CHOLECYSTECTOMY WITH INTRAOPERATIVE CHOLANGIOGRAM;  Surgeon: Adin Hector, MD;  Location: [place];  Service: General;  Laterality: N/A;   neck pain     WISDOM TOOTH EXTRACTION  2016    History reviewed. No pertinent family history. Social History:  reports that she has never smoked. She has never used smokeless tobacco. She reports that she does not drink alcohol and does not use drugs.  Allergies: No Known Allergies  Medications Prior to Admission  Medication Sig Dispense Refill    amitriptyline (ELAVIL) 25 MG tablet Take 25-50 mg by mouth at bedtime.     Aspirin-Acetaminophen-Caffeine (GOODY HEADACHE PO) Take 1 packet by mouth daily as needed (pain).     budesonide-formoterol (SYMBICORT) 160-4.5 MCG/ACT inhaler Inhale 2 puffs into the lungs 2 (two) times daily. 1 each 12   buPROPion (WELLBUTRIN XL) 150 MG 24 hr tablet Take 150 mg by mouth daily.     cholecalciferol (VITAMIN D3) 25 MCG (1000 UNIT) tablet Take 1,000 Units by mouth daily.     escitalopram (LEXAPRO) 20 MG tablet Take 20 mg by mouth in the morning.     fenofibrate (TRICOR) 145 MG tablet Take 145 mg by mouth daily.     fluticasone (FLOVENT HFA) 220 MCG/ACT inhaler Inhale 1 puff into the lungs daily as needed (shortness of breath).     Fluticasone Furoate (ARNUITY ELLIPTA) 200 MCG/ACT AEPB Inhale 1 puff into the lungs daily.     HYDROcodone-acetaminophen (NORCO/VICODIN) 5-325 MG tablet Take 1 tablet by mouth 3 (three) times daily.     hydrOXYzine (ATARAX) 25 MG tablet Take 25 mg by mouth 3 (three) times daily as needed for anxiety.     losartan (COZAAR) 50 MG tablet Take 75 mg by mouth in the morning.     Omega-3 Fatty Acids (FISH OIL) 1200 MG CPDR Take 1,200 mg by mouth 2 (two) times daily.     omeprazole (PRILOSEC) 40 MG capsule Take 40 mg by mouth daily before breakfast.     vitamin B-12 (  CYANOCOBALAMIN) 500 MCG tablet Take 500 mcg by mouth daily.     Vitamin D, Ergocalciferol, (DRISDOL) 1.25 MG (50000 UNIT) CAPS capsule Take 100,000 Units by mouth every Sunday.     methocarbamol (ROBAXIN) 500 MG tablet Take 500 mg by mouth 3 (three) times daily as needed for muscle spasms.      Results for orders placed or performed during the hospital encounter of 10/17/22 (from the past 48 hour(s))  Pregnancy, urine POC     Status: None   Collection Time: 10/17/22  6:17 AM  Result Value Ref Range   Preg Test, Ur NEGATIVE NEGATIVE    Comment:        THE SENSITIVITY OF THIS METHODOLOGY IS >24 mIU/mL    No results  found.  Pertinent items noted in HPI and remainder of comprehensive ROS otherwise negative.  Blood pressure 102/69, pulse 86, temperature 98.5 F (36.9 C), temperature source Oral, resp. rate 18, height '5\' 4"'$  (1.626 m), weight 104.3 kg, last menstrual period 09/28/2022, SpO2 99 %.  Awake and alert.  Oriented and appropriate.'s cranial nerve function intact.  Motor 5/5 bilaterally.  Sensory examination is decreased sensation in her left median nerve distribution.  Deep tendon reflexes brisk but symmetric.  Gait normal.  Examination head ears eyes nose and throat is unremarkable her chest and abdomen benign.  Extremities are free of major deformity.  Positive left wrist Tinel's sign. Assessment/Plan Left carpal tunnel syndrome.  Plan left carpal tunnel release.  Risk and benefits been explained.  Patient wishes to proceed.  Mallie Mussel A Keyera Hattabaugh 10/17/2022, 7:52 AM

## 2022-10-17 NOTE — Op Note (Signed)
Date of procedure: 10/17/2022  Date of dictation: Same  Service: Neurosurgery  Preoperative diagnosis: Left carpal tunnel syndrome  Postoperative diagnosis: Same  Procedure Name: Carpal tunnel release  Surgeon:Jenniger Figiel A.Clarion Mooneyhan, M.D.  Asst. Surgeon: None  Anesthesia: General  Indication: 41 year old female with history of symptomatic bilateral carpal tunnel syndrome status post right-sided carpal tunnel release.  Patient presents now for left-sided carpal tunnel release.  Operative note: After induction of IV anesthesia.  Patient prepped and draped sterilely.  Local lidocaine infiltrated along the planned incision which was along the mid palmar crease just distal to the distal wrist fold.  This carried down sharply through the palmar fascia.  The transverse ligament was identified.  This was divided and the underlying median nerve was exposed.  The median nerve was then protected using a Soil scientist.  The ligament was divided distally into the palm and then proximally into the distal wrist.  The nerve itself was free from injury.  There is no evidence of any residual compression.  Wound was irrigated and then closed in typical fashion.  Sterile dressings applied.  No apparent complications.  Patient tolerated the procedure well and she returns to the recovery room postop.

## 2022-10-17 NOTE — Discharge Instructions (Signed)
Keep wound covered.  Keep wound dry.  May shower with bag over dressing.  Change dressing every 3 days.  Take care not to wrap the Ace bandage too tightly.  Return to office in 10 days for suture removal

## 2022-10-17 NOTE — Brief Op Note (Signed)
10/17/2022  8:58 AM  PATIENT:  Tiffany Ho  41 y.o. female  PRE-OPERATIVE DIAGNOSIS:  Carpal tunnel syndrome  POST-OPERATIVE DIAGNOSIS:  * No post-op diagnosis entered *  PROCEDURE:  Procedure(s): LEFT CARPAL TUNNEL RELEASE (Left)  SURGEON:  Surgeon(s) and Role:    * Earnie Larsson, MD - Primary  PHYSICIAN ASSISTANT:   ASSISTANTS:    ANESTHESIA:   local and IV sedation  EBL:  2 mL   BLOOD ADMINISTERED:none  DRAINS: none   LOCAL MEDICATIONS USED:  XYLOCAINE   SPECIMEN:  No Specimen  DISPOSITION OF SPECIMEN:  N/A  COUNTS:  YES  TOURNIQUET:  * No tourniquets in log *  DICTATION: .Dragon Dictation  PLAN OF CARE: Discharge to home after PACU  PATIENT DISPOSITION:  PACU - hemodynamically stable.   Delay start of Pharmacological VTE agent (>24hrs) due to surgical blood loss or risk of bleeding: yes

## 2022-10-18 ENCOUNTER — Encounter (HOSPITAL_COMMUNITY): Payer: Self-pay | Admitting: Neurosurgery

## 2022-10-20 ENCOUNTER — Ambulatory Visit: Payer: Medicaid Other | Admitting: Cardiology

## 2022-10-24 ENCOUNTER — Other Ambulatory Visit: Payer: Medicaid Other

## 2022-11-13 ENCOUNTER — Ambulatory Visit
Admission: RE | Admit: 2022-11-13 | Discharge: 2022-11-13 | Disposition: A | Discharge status: Home / Self Care | Payer: Medicaid Other | Source: Ambulatory Visit | Attending: Pulmonary Disease | Admitting: Pulmonary Disease

## 2022-11-13 DIAGNOSIS — R0609 Other forms of dyspnea: Secondary | ICD-10-CM

## 2022-11-13 MED ORDER — IOPAMIDOL (ISOVUE-370) INJECTION 76%
75.0000 mL | Freq: Once | INTRAVENOUS | Status: AC | PRN
  Administered 2022-11-13: 75 mL via INTRAVENOUS

## 2022-11-22 ENCOUNTER — Encounter: Payer: Self-pay | Admitting: Pulmonary Disease

## 2022-11-22 ENCOUNTER — Ambulatory Visit: Payer: Medicaid Other | Admitting: Pulmonary Disease

## 2022-11-22 VITALS — BP 126/70 | HR 120 | Wt 235.0 lb

## 2022-11-22 DIAGNOSIS — R0609 Other forms of dyspnea: Secondary | ICD-10-CM | POA: Diagnosis not present

## 2022-11-22 NOTE — Patient Instructions (Addendum)
Nice to see you again  Lets get pulmonary function test to make sure we are not missing something  Your CT scan looks totally clear this is great news  Return to clinic as needed based on results of the pulmonary function test

## 2022-11-22 NOTE — Progress Notes (Signed)
$'@Patient'O$  ID: Tiffany Ho, female    DOB: [birth year], 42 y.o.   MRN: [id number]  Chief Complaint  Patient presents with   Follow-up    Follow up for [place]. Pt states that she has been doing well. Symbicort is working well fo rher so far. Pt is also taking now Arnuity and Flovent. Pt states that she is still SOB at times. With exertion she gets SOB. She states she is not sure if the inhalers are helping.     Referring provider: Josefa Half*  HPI:   42 y.o. woman whom we are seeing in evaluation of dyspnea on exertion.  Operative note from neurosurgery for carpal tunnel reviewed.    Overall, symptoms largely unchanged.  I prescribed Symbicort last visit for possible asthma.  No real improvement.  May be temporary but overall symptoms largely unchanged.  Feels like it helps for a little bit.  She has been taking Flovent as needed in the day.  Again, symptoms improved for an hour or 2 but right back to normal.  No significant improvement.  In the interim, CTA PE protocol was obtained given her dyspnea as well as tachycardia.  This reveals clear lungs, no PE.  This was shared with her.  This very encouraging news.  Discussed given clear parenchyma no PE, it is unlikely that lungs are contributing to symptoms.  However, we discussed pursuing PFTs to make sure not missing something imaging may have missed.  She expressed understanding.  She is frustrated by her lack of improvement but understands that we are trying to find out the best way to help.  HPI at initial visit: Patient was in usual state of health.  Underwent cervical spine surgery.  06/2021.  Developed shortness of breath afterwards.  Second cervical spine surgery 03/2022.  Worsening shortness of breath.  To the point where developed shortness of breath at rest.  In addition to dyspnea on exertion that predated this.  Worse with inclines or stairs.  Severity comes and goes.  She thinks anxiety or stress has a lot to do with it.   Maybe slightly better although not a lot after using her starting medication for anxiety.  Gradually getting worse.  No time of day when things are better or worse.  No position make things better or worse.  No seasonal or environmental factors she can identify the big things better or worse.  No other alleviating or exacerbating factors.  She also has issues with swallowing, dysphagia.  Esophageal dysphagia most recent SLP eval 07/2022 on my review.  No chest imaging in the interim.  PMH: Anxiety, eosinophilic esophagitis? Surgical history: Cervical spine surgery on the right x2 Family history:History reviewed. No pertinent family history. Social history: Never smoker, lives in [place] / Pulmonary Flowsheets:   ACT:      No data to display           MMRC:     No data to display           Epworth:      No data to display           Tests:   FENO:  No results found for: "NITRICOXIDE"  PFT:     No data to display           WALK:      No data to display           Imaging: Personally reviewed and interpreted as per EMR  and discussion in this note CT Angio Chest W/Cm &/Or Wo Cm  Result Date: 11/13/2022 CLINICAL DATA:  Dyspnea on exertion EXAM: CT ANGIOGRAPHY CHEST WITH CONTRAST TECHNIQUE: Multidetector CT imaging of the chest was performed using the standard protocol during bolus administration of intravenous contrast. Multiplanar CT image reconstructions and MIPs were obtained to evaluate the vascular anatomy. RADIATION DOSE REDUCTION: This exam was performed according to the departmental dose-optimization program which includes automated exposure control, adjustment of the mA and/or kV according to patient size and/or use of iterative reconstruction technique. CONTRAST:  10m ISOVUE-370 IOPAMIDOL (ISOVUE-370) INJECTION 76% COMPARISON:  None Available. FINDINGS: Cardiovascular: Satisfactory opacification of the pulmonary arteries to the  segmental level. No evidence of pulmonary embolism. Normal heart size. No pericardial effusion. Mediastinum/Nodes: No enlarged mediastinal, hilar, or axillary lymph nodes. Thyroid gland, trachea, and esophagus demonstrate no significant findings. Lungs/Pleura: Lungs are clear. No pleural effusion or pneumothorax. Upper Abdomen: No acute abnormality. Diffuse low attenuation of the liver as can be seen with hepatic steatosis Musculoskeletal: No chest wall abnormality. No acute or significant osseous findings. Review of the MIP images confirms the above findings. IMPRESSION: 1. No pulmonary embolism or other acute intrathoracic findings. 2. Hepatic steatosis. Electronically Signed   By: HKathreen DevoidM.D.   On: 11/13/2022 12:07    Lab Results: Personally reviewed, notably eosinophils elevated to 700 06/2021 CBC    Component Value Date/Time   WBC 6.2 08/30/2022 0554   RBC 4.10 08/30/2022 0554   HGB 12.2 08/30/2022 0554   HCT 36.3 08/30/2022 0554   PLT 458 (H) 08/30/2022 0554   MCV 88.5 08/30/2022 0554   MCH 29.8 08/30/2022 0554   MCHC 33.6 08/30/2022 0554   RDW 12.8 08/30/2022 0554   RDW 13.1 05/21/2014 1616   LYMPHSABS 2.4 06/13/2021 0900   LYMPHSABS 3.0 05/21/2014 1616   MONOABS 0.7 06/13/2021 0900   EOSABS 0.7 (H) 06/13/2021 0900   EOSABS 0.3 05/21/2014 1616   BASOSABS 0.1 06/13/2021 0900   BASOSABS 0.0 05/21/2014 1616    BMET    Component Value Date/Time   NA 139 08/30/2022 0554   NA 139 05/21/2014 1616   K 3.7 08/30/2022 0554   CL 109 08/30/2022 0554   CO2 23 08/30/2022 0554   GLUCOSE 121 (H) 08/30/2022 0554   BUN 14 08/30/2022 0554   BUN 10 05/21/2014 1616   CREATININE 0.78 08/30/2022 0554   CALCIUM 8.7 (L) 08/30/2022 0554   GFRNONAA >60 08/30/2022 0554   GFRAA >60 08/13/2019 1300    BNP No results found for: "BNP"  ProBNP No results found for: "PROBNP"  Specialty Problems   None   No Known Allergies  Immunization History  Administered Date(s) Administered    Td (Adult),5 Lf Tetanus Toxid, Preservative Free 06/07/2022    Past Medical History:  Diagnosis Date   Anxiety    Arthritis    degenerative disc disease and carpal tunnel bilateral hands   Carpal tunnel syndrome, bilateral    Cervical radiculopathy    Constipation    Depression    Diaphoresis    Gallstones    GERD (gastroesophageal reflux disease)    Hair loss disorder    Hypertension    Malaise and fatigue    Panic attacks    Seasonal allergies    Sleep apnea    SOB (shortness of breath)     Tobacco History: Social History   Tobacco Use  Smoking Status Never  Smokeless Tobacco Never   Counseling given: Not Answered  Continue to not smoke  Outpatient Encounter Medications as of 11/22/2022  Medication Sig   amitriptyline (ELAVIL) 25 MG tablet Take 25-50 mg by mouth at bedtime.   Aspirin-Acetaminophen-Caffeine (GOODY HEADACHE PO) Take 1 packet by mouth daily as needed (pain).   budesonide-formoterol (SYMBICORT) 160-4.5 MCG/ACT inhaler Inhale 2 puffs into the lungs 2 (two) times daily.   buPROPion (WELLBUTRIN XL) 150 MG 24 hr tablet Take 150 mg by mouth daily.   cholecalciferol (VITAMIN D3) 25 MCG (1000 UNIT) tablet Take 1,000 Units by mouth daily.   escitalopram (LEXAPRO) 20 MG tablet Take 20 mg by mouth in the morning.   fenofibrate (TRICOR) 145 MG tablet Take 145 mg by mouth daily.   fluticasone (FLOVENT HFA) 220 MCG/ACT inhaler Inhale 1 puff into the lungs daily as needed (shortness of breath).   Fluticasone Furoate (ARNUITY ELLIPTA) 200 MCG/ACT AEPB Inhale 1 puff into the lungs daily.   HYDROcodone-acetaminophen (NORCO/VICODIN) 5-325 MG tablet Take 1 tablet by mouth 3 (three) times daily.   hydrOXYzine (ATARAX) 25 MG tablet Take 25 mg by mouth 3 (three) times daily as needed for anxiety.   losartan (COZAAR) 50 MG tablet Take 75 mg by mouth in the morning.   methocarbamol (ROBAXIN) 500 MG tablet Take 500 mg by mouth 3 (three) times daily as needed for muscle spasms.    Omega-3 Fatty Acids (FISH OIL) 1200 MG CPDR Take 1,200 mg by mouth 2 (two) times daily.   omeprazole (PRILOSEC) 40 MG capsule Take 40 mg by mouth daily before breakfast.   vitamin B-12 (CYANOCOBALAMIN) 500 MCG tablet Take 500 mcg by mouth daily.   Vitamin D, Ergocalciferol, (DRISDOL) 1.25 MG (50000 UNIT) CAPS capsule Take 100,000 Units by mouth every Sunday.   No facility-administered encounter medications on file as of 11/22/2022.     Review of Systems  Review of Systems  n/a Physical Exam  BP 126/70 (BP Location: Left Arm, Patient Position: Sitting, Cuff Size: Normal)   Pulse (!) 120   Wt 235 lb (106.6 kg)   SpO2 96%   BMI 40.34 kg/m   Wt Readings from Last 5 Encounters:  11/22/22 235 lb (106.6 kg)  10/17/22 230 lb (104.3 kg)  09/20/22 230 lb 3.2 oz (104.4 kg)  08/30/22 227 lb (103 kg)  07/25/22 224 lb (101.6 kg)    BMI Readings from Last 5 Encounters:  11/22/22 40.34 kg/m  10/17/22 39.48 kg/m  09/20/22 39.51 kg/m  08/30/22 38.96 kg/m  07/25/22 38.45 kg/m     Physical Exam General: Sitting in chair, mild respiratory distress Eyes: EOMI, no icterus Neck: Supple, JVP Pulmonary: Clear, equal and symmetric in bilateral bases, normal work of breathing with occasional increase in sigh breaths, intermittent tachypnea at rest that self resolves Cardiovascular: Warm, no edema Abdomen: Nondistended, bowel sounds present MSK: No synovitis, no joint effusion Neuro: Normal gait, no weakness Psych: Normal mood, normal affect  Assessment & Plan:   Shortness of breath/dyspnea on exertion: Started after cervical spine surgery fall 2022 and second cervical spine surgery 04/2022.  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  However her lung sounds are clear bilaterally without evidence of diminishment in the right base.  No evidence of hemidiaphragm elevation on chest x-ray or CT scan.  CTA PE protocol clear, no PE.   Trial  ICS/LABA therapy via high-dose Symbicort for possible asthma given elevated eosinophils on serial blood counts, without significant improvement.  She has gained weight that could be contributing.  PFTs for further evaluation.  Subtle signs of asthma or air trapping or bronchodilator spots, may need to consider biologic medications.  Follow-up based on results of PFT, if stone cold normal, no follow-up needed.   Return if symptoms worsen or fail to improve.   Lanier Clam, MD 11/22/2022

## 2022-11-24 ENCOUNTER — Ambulatory Visit: Payer: Medicaid Other | Admitting: Pulmonary Disease

## 2022-11-24 DIAGNOSIS — R0609 Other forms of dyspnea: Secondary | ICD-10-CM

## 2022-11-24 LAB — PULMONARY FUNCTION TEST
DL/VA % pred: 97 %
DL/VA: 4.29 ml/min/mmHg/L
DLCO cor % pred: 98 %
DLCO cor: 21.13 ml/min/mmHg
DLCO unc % pred: 97 %
DLCO unc: 20.73 ml/min/mmHg
FEF 25-75 Post: 2.85 L/sec
FEF 25-75 Pre: 4.12 L/sec
FEF2575-%Change-Post: -30 %
FEF2575-%Pred-Post: 93 %
FEF2575-%Pred-Pre: 135 %
FEV1-%Change-Post: -7 %
FEV1-%Pred-Post: 100 %
FEV1-%Pred-Pre: 108 %
FEV1-Post: 2.94 L
FEV1-Pre: 3.17 L
FEV1FVC-%Change-Post: -2 %
FEV1FVC-%Pred-Pre: 107 %
FEV6-%Change-Post: -4 %
FEV6-%Pred-Post: 96 %
FEV6-%Pred-Pre: 100 %
FEV6-Post: 3.42 L
FEV6-Pre: 3.57 L
FEV6FVC-%Pred-Post: 101 %
FEV6FVC-%Pred-Pre: 101 %
FVC-%Change-Post: -5 %
FVC-%Pred-Post: 94 %
FVC-%Pred-Pre: 99 %
FVC-Post: 3.42 L
FVC-Pre: 3.6 L
Post FEV1/FVC ratio: 86 %
Post FEV6/FVC ratio: 100 %
Pre FEV1/FVC ratio: 88 %
Pre FEV6/FVC Ratio: 100 %
RV % pred: 77 %
RV: 1.23 L
TLC % pred: 99 %
TLC: 4.93 L

## 2022-11-24 NOTE — Progress Notes (Signed)
PFT done today. 

## 2023-01-02 ENCOUNTER — Other Ambulatory Visit (HOSPITAL_COMMUNITY): Payer: Self-pay | Admitting: Physician Assistant

## 2023-01-02 DIAGNOSIS — M5412 Radiculopathy, cervical region: Secondary | ICD-10-CM

## 2023-01-12 ENCOUNTER — Ambulatory Visit (HOSPITAL_COMMUNITY)
Admission: RE | Admit: 2023-01-12 | Discharge: 2023-01-12 | Disposition: A | Discharge status: Home / Self Care | Payer: Medicaid Other | Source: Ambulatory Visit | Attending: Physician Assistant | Admitting: Physician Assistant

## 2023-01-12 DIAGNOSIS — M5412 Radiculopathy, cervical region: Secondary | ICD-10-CM | POA: Insufficient documentation

## 2023-06-18 ENCOUNTER — Other Ambulatory Visit (HOSPITAL_BASED_OUTPATIENT_CLINIC_OR_DEPARTMENT_OTHER): Payer: Self-pay | Admitting: Physician Assistant

## 2023-06-18 DIAGNOSIS — M5412 Radiculopathy, cervical region: Secondary | ICD-10-CM

## 2023-06-29 ENCOUNTER — Ambulatory Visit (HOSPITAL_BASED_OUTPATIENT_CLINIC_OR_DEPARTMENT_OTHER)
Admission: RE | Admit: 2023-06-29 | Discharge: 2023-06-29 | Disposition: A | Discharge status: Home / Self Care | Payer: Medicaid Other | Source: Ambulatory Visit | Attending: Physician Assistant | Admitting: Physician Assistant

## 2023-06-29 DIAGNOSIS — M5412 Radiculopathy, cervical region: Secondary | ICD-10-CM | POA: Diagnosis present

## 2023-08-29 ENCOUNTER — Other Ambulatory Visit (HOSPITAL_COMMUNITY): Payer: Self-pay | Admitting: Family Medicine

## 2023-08-29 DIAGNOSIS — Z1231 Encounter for screening mammogram for malignant neoplasm of breast: Secondary | ICD-10-CM

## 2025-01-16 ENCOUNTER — Ambulatory Visit (HOSPITAL_COMMUNITY): Admit: 2025-01-16 | Admitting: Otolaryngology

## 2025-01-16 SURGERY — EXCISION, MASS, NOSE
Anesthesia: Monitor Anesthesia Care | Laterality: Right
